# Patient Record
Sex: Female | Born: 1935 | Race: White | Hispanic: No | Smoking: Never smoker
Health system: Southern US, Community
[De-identification: ages and names within clinical notes are randomized; demographics above are authoritative.]

## PROBLEM LIST (undated history)

## (undated) DIAGNOSIS — G9341 Metabolic encephalopathy: Secondary | ICD-10-CM

## (undated) DIAGNOSIS — I5032 Chronic diastolic (congestive) heart failure: Secondary | ICD-10-CM

## (undated) DIAGNOSIS — G20A1 Parkinson's disease without dyskinesia, without mention of fluctuations: Secondary | ICD-10-CM

## (undated) DIAGNOSIS — J690 Pneumonitis due to inhalation of food and vomit: Secondary | ICD-10-CM

## (undated) DIAGNOSIS — J9621 Acute and chronic respiratory failure with hypoxia: Secondary | ICD-10-CM

## (undated) DIAGNOSIS — I10 Essential (primary) hypertension: Secondary | ICD-10-CM

## (undated) DIAGNOSIS — G20 Parkinson's disease: Secondary | ICD-10-CM

## (undated) HISTORY — PX: CATARACT EXTRACTION: SUR2

## (undated) HISTORY — PX: HIP SURGERY: SHX245

## (undated) HISTORY — DX: Essential (primary) hypertension: I10

---

## 2018-09-16 ENCOUNTER — Ambulatory Visit (HOSPITAL_COMMUNITY)
Admission: AD | Admit: 2018-09-16 | Discharge: 2018-09-16 | Disposition: A | Discharge status: Home / Self Care | Payer: Medicare Other | Source: Other Acute Inpatient Hospital | Attending: Internal Medicine | Admitting: Internal Medicine

## 2018-09-16 ENCOUNTER — Inpatient Hospital Stay (HOSPITAL_COMMUNITY)
Admission: RE | Admit: 2018-09-16 | Discharge: 2018-10-05 | Disposition: A | Discharge status: Skilled Nursing Facility | Payer: Medicare Other | Source: Other Acute Inpatient Hospital | Attending: Internal Medicine | Admitting: Internal Medicine

## 2018-09-16 DIAGNOSIS — R0902 Hypoxemia: Secondary | ICD-10-CM | POA: Diagnosis present

## 2018-09-16 DIAGNOSIS — J690 Pneumonitis due to inhalation of food and vomit: Secondary | ICD-10-CM | POA: Diagnosis present

## 2018-09-16 DIAGNOSIS — G9341 Metabolic encephalopathy: Secondary | ICD-10-CM | POA: Diagnosis present

## 2018-09-16 DIAGNOSIS — J9621 Acute and chronic respiratory failure with hypoxia: Secondary | ICD-10-CM | POA: Diagnosis present

## 2018-09-16 DIAGNOSIS — G20 Parkinson's disease: Secondary | ICD-10-CM | POA: Diagnosis present

## 2018-09-16 DIAGNOSIS — I5032 Chronic diastolic (congestive) heart failure: Secondary | ICD-10-CM | POA: Diagnosis present

## 2018-09-16 HISTORY — DX: Pneumonitis due to inhalation of food and vomit: J69.0

## 2018-09-16 HISTORY — DX: Acute and chronic respiratory failure with hypoxia: J96.21

## 2018-09-16 HISTORY — DX: Parkinson's disease without dyskinesia, without mention of fluctuations: G20.A1

## 2018-09-16 HISTORY — DX: Parkinson's disease: G20

## 2018-09-16 HISTORY — DX: Chronic diastolic (congestive) heart failure: I50.32

## 2018-09-16 HISTORY — DX: Metabolic encephalopathy: G93.41

## 2018-09-17 DIAGNOSIS — I5032 Chronic diastolic (congestive) heart failure: Secondary | ICD-10-CM

## 2018-09-17 DIAGNOSIS — J690 Pneumonitis due to inhalation of food and vomit: Secondary | ICD-10-CM | POA: Diagnosis not present

## 2018-09-17 DIAGNOSIS — G20 Parkinson's disease: Secondary | ICD-10-CM

## 2018-09-17 DIAGNOSIS — J9621 Acute and chronic respiratory failure with hypoxia: Secondary | ICD-10-CM | POA: Diagnosis not present

## 2018-09-17 DIAGNOSIS — G9341 Metabolic encephalopathy: Secondary | ICD-10-CM | POA: Diagnosis not present

## 2018-09-17 LAB — BASIC METABOLIC PANEL
Anion gap: 10 (ref 5–15)
BUN: 19 mg/dL (ref 8–23)
CO2: 32 mmol/L (ref 22–32)
Calcium: 8.4 mg/dL — ABNORMAL LOW (ref 8.9–10.3)
Chloride: 94 mmol/L — ABNORMAL LOW (ref 98–111)
Creatinine, Ser: 0.93 mg/dL (ref 0.44–1.00)
GFR calc Af Amer: >60 mL/min (ref >60)
GFR calc non Af Amer: 56 mL/min — ABNORMAL LOW (ref >60)
Glucose, Bld: 79 mg/dL (ref 70–99)
Potassium: 3.5 mmol/L (ref 3.5–5.1)
Sodium: 136 mmol/L (ref 135–145)

## 2018-09-17 LAB — CBC
HCT: 33.6 % — ABNORMAL LOW (ref 36.0–46.0)
Hemoglobin: 10.5 g/dL — ABNORMAL LOW (ref 12.0–15.0)
MCH: 31 pg (ref 26.0–34.0)
MCHC: 31.3 g/dL (ref 30.0–36.0)
MCV: 99.1 fL (ref 80.0–100.0)
Platelets: 237 10*3/uL (ref 150–400)
RBC: 3.39 MIL/uL — ABNORMAL LOW (ref 3.87–5.11)
RDW: 15.5 % (ref 11.5–15.5)
WBC: 7.5 10*3/uL (ref 4.0–10.5)
nRBC: 0 % (ref 0.0–0.2)

## 2018-09-17 NOTE — Consult Note (Signed)
Pulmonary Critical Care Medicine [facility] [place]  PULMONARY SERVICE  Date of Service: 09/17/2018  PULMONARY CRITICAL CARE CONSULT   Leasa Blaze  WUJ:[id number]  DOB: Aug 27, 1929   DOA: 09/16/2018  Referring Physician: Carron Curie, MD  HPI: Jazmin Crespin is a 85 y.o. female seen for follow up of Acute on Chronic Respiratory Failure.  She has a history of Parkinson's disease hypertension hyperlipidemia hypothyroidism cardiac failure pulmonary hypertension reflux of the veins presented to the hospital because of alteration in her mental status.  Patient at that time was found to be significantly hypotensive and felt to be septic.  Further evaluation revealed urinary tract infection and possible aspiration pneumonia.  Patient was started on Levophed and started on noninvasive ventilation initially.  Started on Rocephin as well as azithromycin which was later on changed to Zosyn and vancomycin.  Urine culture grew enterococcus.  Antibiotics were adjusted accordingly.  Now presents to our facility for further management and weaning.  At the time of evaluation she was on high flow nasal cannula  Review of Systems:  ROS performed and is unremarkable other than noted above.  Past medical history: Parkinson's disease Hypertension Hyperlipidemia Hypothyroidism Chronic diastolic heart failure Pulmonary hypertension Lower extremity venous insufficiency Chronic kidney disease Chronic anemia  Past surgical history: Tubal ligation Right hip surgery Total knee replacement Cataract surgery  Social history does not smoke or drink no alcohol or drug abuse  Allergies Sulfa  Family history: Noncontributory  Medications: Reviewed on Rounds  Physical Exam:  Vitals: Patient's temperature is 96.4 pulse 73 respiratory rate 15 blood pressure 149/80 saturations 93%  Ventilator Settings off the ventilator right now but is on Vapotherm 25 L flow rate with 50%  FiO2  . General: Comfortable at this time . Eyes: Grossly normal lids, irises & conjunctiva . ENT: grossly tongue is normal . Neck: no obvious mass . Cardiovascular: S1-S2 normal no gallop or rub is noted . Respiratory: No rhonchi or rales at this time . Abdomen: Soft and nontender . Skin: no rash seen on limited exam . Musculoskeletal: not rigid . Psychiatric:unable to assess . Neurologic: no seizure no involuntary movements         Labs on Admission:  Basic Metabolic Panel: Recent Labs  Lab 09/17/18 0425  NA 136  K 3.5  CL 94*  CO2 32  GLUCOSE 79  BUN 19  CREATININE 0.93  CALCIUM 8.4*    No results for input(s): PHART, PCO2ART, PO2ART, HCO3, O2SAT in the last 168 hours.  Liver Function Tests: No results for input(s): AST, ALT, ALKPHOS, BILITOT, PROT, ALBUMIN in the last 168 hours. No results for input(s): LIPASE, AMYLASE in the last 168 hours. No results for input(s): AMMONIA in the last 168 hours.  CBC: Recent Labs  Lab 09/17/18 0425  WBC 7.5  HGB 10.5*  HCT 33.6*  MCV 99.1  PLT 237    Cardiac Enzymes: No results for input(s): CKTOTAL, CKMB, CKMBINDEX, TROPONINI in the last 168 hours.  BNP (last 3 results) No results for input(s): BNP in the last 8760 hours.  ProBNP (last 3 results) No results for input(s): PROBNP in the last 8760 hours.   Radiological Exams on Admission: No results found.  Assessment/Plan Active Problems:   Acute on chronic respiratory failure with hypoxia (HCC)   Aspiration pneumonia due to gastric secretions (HCC)   Acute metabolic encephalopathy   Parkinson's disease (tremor, stiffness, slow motion, unstable posture) (HCC)   Chronic diastolic heart failure (HCC)  1. Acute on chronic respiratory failure with hypoxia patient continues to be on Vapotherm.  Need to titrate oxygen down as tolerated.  Continue with aggressive pulmonary toilet and supportive care.  Chest x-ray should be done 2. Pneumonia due to aspiration the  patient has been treated with broad-spectrum antibiotics.  We will continue with supportive care speech therapy will assess the patient. 3. Metabolic encephalopathy acute improving patient is more awake and alert 4. Acute renal failure we will follow-up labs monitor BUN/creatinine and fluid hydration status.  Right now has resolved 5. Parkinson's disease advanced disease will need a swallowing assessment 6. Chronic diastolic heart failure monitor fluid status diuretics as necessary  I have personally seen and evaluated the patient, evaluated laboratory and imaging results, formulated the assessment and plan and placed orders. The Patient requires high complexity decision making for assessment and support.  Case was discussed on Rounds with the Respiratory Therapy Staff Time Spent  Yevonne Pax, MD [facility] Pulmonary Critical Care Medicine Sleep Medicine

## 2018-09-18 ENCOUNTER — Encounter: Payer: Self-pay | Admitting: Internal Medicine

## 2018-09-18 DIAGNOSIS — G9341 Metabolic encephalopathy: Secondary | ICD-10-CM | POA: Diagnosis present

## 2018-09-18 DIAGNOSIS — I5032 Chronic diastolic (congestive) heart failure: Secondary | ICD-10-CM | POA: Diagnosis not present

## 2018-09-18 DIAGNOSIS — G20 Parkinson's disease: Secondary | ICD-10-CM | POA: Diagnosis present

## 2018-09-18 DIAGNOSIS — J9621 Acute and chronic respiratory failure with hypoxia: Secondary | ICD-10-CM | POA: Diagnosis not present

## 2018-09-18 DIAGNOSIS — J690 Pneumonitis due to inhalation of food and vomit: Secondary | ICD-10-CM | POA: Diagnosis present

## 2018-09-18 NOTE — Progress Notes (Addendum)
Pulmonary Critical Care Medicine [facility] [place]   PULMONARY CRITICAL CARE SERVICE  PROGRESS NOTE  Date of Service: 09/18/2018  Romelle Ille  [id number]:[id number]  DOB: 08-13-1929   DOA: 09/16/2018  Referring Physician: Carron Curie, MD  HPI: Maricela Alling is a 85 y.o. female seen for follow up of Acute on Chronic Respiratory Failure.  Patient remains on heated high flow nasal cannula 20 L and 40% FiO2.  No acute distress is noted at this time.  Medications: Reviewed on Rounds  Physical Exam:  Vitals: Pulse 81 respiration 17 BP 103/54 O2 sat 94% temp 97.9  Ventilator Settings heated high flow 20 L 40%  . General: Comfortable at this time . Eyes: Grossly normal lids, irises & conjunctiva . ENT: grossly tongue is normal . Neck: no obvious mass . Cardiovascular: S1 S2 normal no gallop . Respiratory: No rales or rhonchi noted . Abdomen: soft . Skin: no rash seen on limited exam . Musculoskeletal: not rigid . Psychiatric:unable to assess . Neurologic: no seizure no involuntary movements         Lab Data:   Basic Metabolic Panel: Recent Labs  Lab 09/17/18 0425  NA 136  K 3.5  CL 94*  CO2 32  GLUCOSE 79  BUN 19  CREATININE 0.93  CALCIUM 8.4*    ABG: No results for input(s): PHART, PCO2ART, PO2ART, HCO3, O2SAT in the last 168 hours.  Liver Function Tests: No results for input(s): AST, ALT, ALKPHOS, BILITOT, PROT, ALBUMIN in the last 168 hours. No results for input(s): LIPASE, AMYLASE in the last 168 hours. No results for input(s): AMMONIA in the last 168 hours.  CBC: Recent Labs  Lab 09/17/18 0425  WBC 7.5  HGB 10.5*  HCT 33.6*  MCV 99.1  PLT 237    Cardiac Enzymes: No results for input(s): CKTOTAL, CKMB, CKMBINDEX, TROPONINI in the last 168 hours.  BNP (last 3 results) No results for input(s): BNP in the last 8760 hours.  ProBNP (last 3 results) No results for input(s): PROBNP in the last 8760 hours.  Radiological Exams: No  results found.  Assessment/Plan Active Problems:   Acute on chronic respiratory failure with hypoxia (HCC)   Aspiration pneumonia due to gastric secretions (HCC)   Acute metabolic encephalopathy   Parkinson's disease (tremor, stiffness, slow motion, unstable posture) (HCC)   Chronic diastolic heart failure (HCC)   1. Acute on chronic respiratory failure with hypoxia patient continues to be on heated high flow nasal cannula.  Continue titrating oxygen as tolerated.  Continue pulmonary toilet and supportive measures. 2. Aspiration pneumonia treated continue supportive measures 3. Metabolic encephalopathy acute improving at this time 4. Acute renal failure continue to follow labs 5. Parkinson's disease advanced disease will need swallowing assessment 6. Chronic diastolic heart failure monitor fluid status and use diuretics as necessary.   I have personally seen and evaluated the patient, evaluated laboratory and imaging results, formulated the assessment and plan and placed orders. The Patient requires high complexity decision making for assessment and support.  Case was discussed on Rounds with the Respiratory Therapy Staff  Yevonne Pax, MD [facility] Pulmonary Critical Care Medicine Sleep Medicine

## 2018-09-19 DIAGNOSIS — J9621 Acute and chronic respiratory failure with hypoxia: Secondary | ICD-10-CM | POA: Diagnosis not present

## 2018-09-19 DIAGNOSIS — G9341 Metabolic encephalopathy: Secondary | ICD-10-CM | POA: Diagnosis not present

## 2018-09-19 DIAGNOSIS — J690 Pneumonitis due to inhalation of food and vomit: Secondary | ICD-10-CM | POA: Diagnosis not present

## 2018-09-19 DIAGNOSIS — I5032 Chronic diastolic (congestive) heart failure: Secondary | ICD-10-CM | POA: Diagnosis not present

## 2018-09-19 NOTE — Progress Notes (Addendum)
Pulmonary Critical Care Medicine [facility] [place]   PULMONARY CRITICAL CARE SERVICE  PROGRESS NOTE  Date of Service: September 19, 2018  Jessica Bullock  [id number]  DOB: 18-May-1930   DOA: 09/16/2018  Referring Physician: Carron Curie, MD  HPI: Jessica Bullock is a 85 y.o. female seen for follow up of Acute on Chronic Respiratory Failure.  Patient remains on heated high flow nasal cannula 20 L and 40% FiO2.  Respiratory managed to decrease patient's FiO2 to 35% for a few hours before she began to desat.  Now back on 40% FiO2.  Medications: Reviewed on Rounds  Physical Exam:  Vitals: Pulse 72 respirations 18 BP 106/53 O2 sat 94% temp 98.2  Ventilator Settings heated high flow 20 L 40% FiO2  . General: Comfortable at this time . Eyes: Grossly normal lids, irises & conjunctiva . ENT: grossly tongue is normal . Neck: no obvious mass . Cardiovascular: S1 S2 normal no gallop . Respiratory: No rales or rhonchi noted . Abdomen: soft . Skin: no rash seen on limited exam . Musculoskeletal: not rigid . Psychiatric:unable to assess . Neurologic: no seizure no involuntary movements         Lab Data:   Basic Metabolic Panel: Recent Labs  Lab 09/17/18 0425  NA 136  K 3.5  CL 94*  CO2 32  GLUCOSE 79  BUN 19  CREATININE 0.93  CALCIUM 8.4*    ABG: No results for input(s): PHART, PCO2ART, PO2ART, HCO3, O2SAT in the last 168 hours.  Liver Function Tests: No results for input(s): AST, ALT, ALKPHOS, BILITOT, PROT, ALBUMIN in the last 168 hours. No results for input(s): LIPASE, AMYLASE in the last 168 hours. No results for input(s): AMMONIA in the last 168 hours.  CBC: Recent Labs  Lab 09/17/18 0425  WBC 7.5  HGB 10.5*  HCT 33.6*  MCV 99.1  PLT 237    Cardiac Enzymes: No results for input(s): CKTOTAL, CKMB, CKMBINDEX, TROPONINI in the last 168 hours.  BNP (last 3 results) No results for input(s): BNP in the last 8760 hours.  ProBNP (last 3  results) No results for input(s): PROBNP in the last 8760 hours.  Radiological Exams: No results found.  Assessment/Plan Active Problems:   Acute on chronic respiratory failure with hypoxia (HCC)   Aspiration pneumonia due to gastric secretions (HCC)   Acute metabolic encephalopathy   Parkinson's disease (tremor, stiffness, slow motion, unstable posture) (HCC)   Chronic diastolic heart failure (HCC)   1. Acute on chronic respiratory failure with hypoxia patient continues to be on heated high flow nasal cannula.  We will continue to attempt to wean oxygen as tolerated.  Continue pulmonary toilet and secretion management. 2. Aspiration pneumonia treated continue boarding measures 3. Metabolic encephalopathy acute improving at this time 4. Acute renal failure continue to follow labs 5. Parkinson's disease advanced disease continue supportive measures 6. Chronic diastolic heart failure monitor the status and use of diuretics.   I have personally seen and evaluated the patient, evaluated laboratory and imaging results, formulated the assessment and plan and placed orders. The Patient requires high complexity decision making for assessment and support.  Case was discussed on Rounds with the Respiratory Therapy Staff  Yevonne Pax, MD [facility] Pulmonary Critical Care Medicine Sleep Medicine

## 2018-09-20 DIAGNOSIS — J690 Pneumonitis due to inhalation of food and vomit: Secondary | ICD-10-CM | POA: Diagnosis not present

## 2018-09-20 DIAGNOSIS — J9621 Acute and chronic respiratory failure with hypoxia: Secondary | ICD-10-CM | POA: Diagnosis not present

## 2018-09-20 DIAGNOSIS — I5032 Chronic diastolic (congestive) heart failure: Secondary | ICD-10-CM | POA: Diagnosis not present

## 2018-09-20 DIAGNOSIS — G9341 Metabolic encephalopathy: Secondary | ICD-10-CM | POA: Diagnosis not present

## 2018-09-20 NOTE — Progress Notes (Addendum)
Pulmonary Critical Care Medicine [facility] [place]   PULMONARY CRITICAL CARE SERVICE  PROGRESS NOTE  Date of Service: 09/20/2018  Jessica Bullock  [id number]  DOB: 25-Oct-1929   DOA: 09/16/2018  Referring Physician: Carron Curie, MD  HPI: Jessica Bullock is a 85 y.o. female seen for follow up of Acute on Chronic Respiratory Failure.  Patient remains on heated high flow nasal cannula 20 L and 40% FiO2.  No distress is noted at this time.  Medications: Reviewed on Rounds  Physical Exam:  Vitals: Pulse 96 respirations 31 BP 140/61 O2 sat 96% temp 98.1  Ventilator Settings heated high flow 20 L 40% FiO2  . General: Comfortable at this time . Eyes: Grossly normal lids, irises & conjunctiva . ENT: grossly tongue is normal . Neck: no obvious mass . Cardiovascular: S1 S2 normal no gallop . Respiratory: No rales or rhonchi noted . Abdomen: soft . Skin: no rash seen on limited exam . Musculoskeletal: not rigid . Psychiatric:unable to assess . Neurologic: no seizure no involuntary movements         Lab Data:   Basic Metabolic Panel: Recent Labs  Lab 09/17/18 0425  NA 136  K 3.5  CL 94*  CO2 32  GLUCOSE 79  BUN 19  CREATININE 0.93  CALCIUM 8.4*    ABG: No results for input(s): PHART, PCO2ART, PO2ART, HCO3, O2SAT in the last 168 hours.  Liver Function Tests: No results for input(s): AST, ALT, ALKPHOS, BILITOT, PROT, ALBUMIN in the last 168 hours. No results for input(s): LIPASE, AMYLASE in the last 168 hours. No results for input(s): AMMONIA in the last 168 hours.  CBC: Recent Labs  Lab 09/17/18 0425  WBC 7.5  HGB 10.5*  HCT 33.6*  MCV 99.1  PLT 237    Cardiac Enzymes: No results for input(s): CKTOTAL, CKMB, CKMBINDEX, TROPONINI in the last 168 hours.  BNP (last 3 results) No results for input(s): BNP in the last 8760 hours.  ProBNP (last 3 results) No results for input(s): PROBNP in the last 8760 hours.  Radiological Exams: No  results found.  Assessment/Plan Active Problems:   Acute on chronic respiratory failure with hypoxia (HCC)   Aspiration pneumonia due to gastric secretions (HCC)   Acute metabolic encephalopathy   Parkinson's disease (tremor, stiffness, slow motion, unstable posture) (HCC)   Chronic diastolic heart failure (HCC)   1. Acute on chronic respiratory failure with hypoxia patient mains heated high flow.  Continue to attempt to wean FiO2 at this time.  Continue pulmonary toilet and secretion management. 2. Aspiration pneumonia treated continue supportive measures 3. Metabolic encephalopathy acute, improving at this time 4. Acute on failure continue follow labs 5. Parkinson's disease advanced disease continue supportive measures 6. Chronic diastolic heart failure monitor fluid status and use of diuretics   I have personally seen and evaluated the patient, evaluated laboratory and imaging results, formulated the assessment and plan and placed orders. The Patient requires high complexity decision making for assessment and support.  Case was discussed on Rounds with the Respiratory Therapy Staff  Yevonne Pax, MD [facility] Pulmonary Critical Care Medicine Sleep Medicine

## 2018-09-21 DIAGNOSIS — I5032 Chronic diastolic (congestive) heart failure: Secondary | ICD-10-CM | POA: Diagnosis not present

## 2018-09-21 DIAGNOSIS — J690 Pneumonitis due to inhalation of food and vomit: Secondary | ICD-10-CM | POA: Diagnosis not present

## 2018-09-21 DIAGNOSIS — J9621 Acute and chronic respiratory failure with hypoxia: Secondary | ICD-10-CM | POA: Diagnosis not present

## 2018-09-21 DIAGNOSIS — G9341 Metabolic encephalopathy: Secondary | ICD-10-CM | POA: Diagnosis not present

## 2018-09-21 NOTE — Progress Notes (Addendum)
Pulmonary Critical Care Medicine [facility] [place]   PULMONARY CRITICAL CARE SERVICE  PROGRESS NOTE  Date of Service: 09/21/2018  Jessica Bullock  [id number]  DOB: June 06, 1930   DOA: 09/16/2018  Referring Physician: Carron Curie, MD  HPI: Jessica Bullock is a 85 y.o. female seen for follow up of Acute on Chronic Respiratory Failure.  Patient remains on heated high flow nasal cannula 15 L and 30% FiO2.  She is doing well and respiratory will try to downgrade to an Oxymizer at this time.  Medications: Reviewed on Rounds  Physical Exam:  Vitals: Pulse 79 respirations 12 BP 101/56 O2 sat 94% temp 97.3  Ventilator Settings heated high flow 15 L 30% FiO2  . General: Comfortable at this time . Eyes: Grossly normal lids, irises & conjunctiva . ENT: grossly tongue is normal . Neck: no obvious mass . Cardiovascular: S1 S2 normal no gallop . Respiratory: No rales or rhonchi noted . Abdomen: soft . Skin: no rash seen on limited exam . Musculoskeletal: not rigid . Psychiatric:unable to assess . Neurologic: no seizure no involuntary movements         Lab Data:   Basic Metabolic Panel: Recent Labs  Lab 09/17/18 0425  NA 136  K 3.5  CL 94*  CO2 32  GLUCOSE 79  BUN 19  CREATININE 0.93  CALCIUM 8.4*    ABG: No results for input(s): PHART, PCO2ART, PO2ART, HCO3, O2SAT in the last 168 hours.  Liver Function Tests: No results for input(s): AST, ALT, ALKPHOS, BILITOT, PROT, ALBUMIN in the last 168 hours. No results for input(s): LIPASE, AMYLASE in the last 168 hours. No results for input(s): AMMONIA in the last 168 hours.  CBC: Recent Labs  Lab 09/17/18 0425  WBC 7.5  HGB 10.5*  HCT 33.6*  MCV 99.1  PLT 237    Cardiac Enzymes: No results for input(s): CKTOTAL, CKMB, CKMBINDEX, TROPONINI in the last 168 hours.  BNP (last 3 results) No results for input(s): BNP in the last 8760 hours.  ProBNP (last 3 results) No results for input(s): PROBNP in  the last 8760 hours.  Radiological Exams: No results found.  Assessment/Plan Active Problems:   Acute on chronic respiratory failure with hypoxia (HCC)   Aspiration pneumonia due to gastric secretions (HCC)   Acute metabolic encephalopathy   Parkinson's disease (tremor, stiffness, slow motion, unstable posture) (HCC)   Chronic diastolic heart failure (HCC)   1. Acute on chronic respiratory failure with hypoxia patient continues on heated high flow however down to 15 L and 30% FiO2.  Doing well enough that we will try on an Oxymizer today and continue weaning if possible.  Continue high toilet and secretion management. 2. Aspiration pneumonia treated continue supportive measures 3. Acute metabolic encephalopathy, improving at this time. 4. Acute Renal failure continue to follow 5. Parkinson's disease, advanced disease, continue supportive measures 6. Chronic diastolic heart failure,monitor fluid stats and use of diuretics.    I have personally seen and evaluated the patient, evaluated laboratory and imaging results, formulated the assessment and plan and placed orders. The Patient requires high complexity decision making for assessment and support.  Case was discussed on Rounds with the Respiratory Therapy Staff  Yevonne Pax, MD [facility] Pulmonary Critical Care Medicine Sleep Medicine

## 2018-09-22 DIAGNOSIS — J690 Pneumonitis due to inhalation of food and vomit: Secondary | ICD-10-CM | POA: Diagnosis not present

## 2018-09-22 DIAGNOSIS — G9341 Metabolic encephalopathy: Secondary | ICD-10-CM | POA: Diagnosis not present

## 2018-09-22 DIAGNOSIS — I5032 Chronic diastolic (congestive) heart failure: Secondary | ICD-10-CM | POA: Diagnosis not present

## 2018-09-22 DIAGNOSIS — J9621 Acute and chronic respiratory failure with hypoxia: Secondary | ICD-10-CM | POA: Diagnosis not present

## 2018-09-22 LAB — BASIC METABOLIC PANEL
Anion gap: 11 (ref 5–15)
BUN: 25 mg/dL — ABNORMAL HIGH (ref 8–23)
CO2: 25 mmol/L (ref 22–32)
Calcium: 9.4 mg/dL (ref 8.9–10.3)
Chloride: 98 mmol/L (ref 98–111)
Creatinine, Ser: 1.23 mg/dL — ABNORMAL HIGH (ref 0.44–1.00)
GFR calc Af Amer: 46 mL/min — ABNORMAL LOW (ref >60)
GFR calc non Af Amer: 40 mL/min — ABNORMAL LOW (ref >60)
Glucose, Bld: 100 mg/dL — ABNORMAL HIGH (ref 70–99)
Potassium: 4.8 mmol/L (ref 3.5–5.1)
Sodium: 134 mmol/L — ABNORMAL LOW (ref 135–145)

## 2018-09-22 NOTE — Progress Notes (Addendum)
Pulmonary Critical Care Medicine [facility] [place]   PULMONARY CRITICAL CARE SERVICE  PROGRESS NOTE  Date of Service: 09/22/2018  Jessica Bullock  [id number]  DOB: April 19, 1930   DOA: 09/16/2018  Referring Physician: Carron Curie, MD  HPI: Jessica Bullock is a 85 y.o. female seen for follow up of Acute on Chronic Respiratory Failure.  Patient has been weaned from heated high flow and cannula down to 6 L on the Oxymizer.  Doing well with good saturation.  Medications: Reviewed on Rounds  Physical Exam:  Vitals: Pulse 67 respirations 18 BP 143/76 O2 sat 100% temp 97.8  Ventilator Settings 6 L on Oxymizer  . General: Comfortable at this time . Eyes: Grossly normal lids, irises & conjunctiva . ENT: grossly tongue is normal . Neck: no obvious mass . Cardiovascular: S1 S2 normal no gallop . Respiratory: No rales or rhonchi noted . Abdomen: soft . Skin: no rash seen on limited exam . Musculoskeletal: not rigid . Psychiatric:unable to assess . Neurologic: no seizure no involuntary movements         Lab Data:   Basic Metabolic Panel: Recent Labs  Lab 09/17/18 0425 09/22/18 1322  NA 136 134*  K 3.5 4.8  CL 94* 98  CO2 32 25  GLUCOSE 79 100*  BUN 19 25*  CREATININE 0.93 1.23*  CALCIUM 8.4* 9.4    ABG: No results for input(s): PHART, PCO2ART, PO2ART, HCO3, O2SAT in the last 168 hours.  Liver Function Tests: No results for input(s): AST, ALT, ALKPHOS, BILITOT, PROT, ALBUMIN in the last 168 hours. No results for input(s): LIPASE, AMYLASE in the last 168 hours. No results for input(s): AMMONIA in the last 168 hours.  CBC: Recent Labs  Lab 09/17/18 0425  WBC 7.5  HGB 10.5*  HCT 33.6*  MCV 99.1  PLT 237    Cardiac Enzymes: No results for input(s): CKTOTAL, CKMB, CKMBINDEX, TROPONINI in the last 168 hours.  BNP (last 3 results) No results for input(s): BNP in the last 8760 hours.  ProBNP (last 3 results) No results for input(s): PROBNP  in the last 8760 hours.  Radiological Exams: No results found.  Assessment/Plan Active Problems:   Acute on chronic respiratory failure with hypoxia (HCC)   Aspiration pneumonia due to gastric secretions (HCC)   Acute metabolic encephalopathy   Parkinson's disease (tremor, stiffness, slow motion, unstable posture) (HCC)   Chronic diastolic heart failure (HCC)   1. Acute on chronic respiratory failure with hypoxia patient has been weaned down to 6 L on the Oxymizer.  Currently satting well.  Continue medical and secretion management 2. Aspiration pneumonia treated continue supportive measures 3. Acute metabolic encephalopathy improving at this time 4. Acute continue to follow 5. Parkinson's disease main disease continue supportive measures 6. Chronic diastolic heart failure monitor fluid status and use of drugs   I have personally seen and evaluated the patient, evaluated laboratory and imaging results, formulated the assessment and plan and placed orders. The Patient requires high complexity decision making for assessment and support.  Case was discussed on Rounds with the Respiratory Therapy Staff  Yevonne Pax, MD [facility] Pulmonary Critical Care Medicine Sleep Medicine

## 2018-09-23 DIAGNOSIS — G9341 Metabolic encephalopathy: Secondary | ICD-10-CM | POA: Diagnosis not present

## 2018-09-23 DIAGNOSIS — J690 Pneumonitis due to inhalation of food and vomit: Secondary | ICD-10-CM | POA: Diagnosis not present

## 2018-09-23 DIAGNOSIS — J9621 Acute and chronic respiratory failure with hypoxia: Secondary | ICD-10-CM | POA: Diagnosis not present

## 2018-09-23 DIAGNOSIS — I5032 Chronic diastolic (congestive) heart failure: Secondary | ICD-10-CM | POA: Diagnosis not present

## 2018-09-23 NOTE — Progress Notes (Addendum)
Pulmonary Critical Care Medicine [facility] [place]   PULMONARY CRITICAL CARE SERVICE  PROGRESS NOTE  Date of Service: 09/23/2018  Jessica Bullock  [id number]  DOB: [birth year]   DOA: 09/16/2018  Referring Physician: Carron Curie, MD  HPI: Jessica Bullock is a 85 y.o. female seen for follow up of Acute on Chronic Respiratory Failure.  Remains on 4 L of oxygen via nasal cannula.  No acute distress noted at this time.  Medications: Reviewed on Rounds  Physical Exam:  Vitals: Pulse 100 respirations 27 BP 130/65 O2 sat 97% 6.8  Ventilator Settings 4 L nasal cannula  . General: Comfortable at this time . Eyes: Grossly normal lids, irises & conjunctiva . ENT: grossly tongue is normal . Neck: no obvious mass . Cardiovascular: S1 S2 normal no gallop . Respiratory: No rales or rhonchi noted . Abdomen: soft . Skin: no rash seen on limited exam . Musculoskeletal: not rigid . Psychiatric:unable to assess . Neurologic: no seizure no involuntary movements         Lab Data:   Basic Metabolic Panel: Recent Labs  Lab 09/17/18 0425 09/22/18 1322  NA 136 134*  K 3.5 4.8  CL 94* 98  CO2 32 25  GLUCOSE 79 100*  BUN 19 25*  CREATININE 0.93 1.23*  CALCIUM 8.4* 9.4    ABG: No results for input(s): PHART, PCO2ART, PO2ART, HCO3, O2SAT in the last 168 hours.  Liver Function Tests: No results for input(s): AST, ALT, ALKPHOS, BILITOT, PROT, ALBUMIN in the last 168 hours. No results for input(s): LIPASE, AMYLASE in the last 168 hours. No results for input(s): AMMONIA in the last 168 hours.  CBC: Recent Labs  Lab 09/17/18 0425  WBC 7.5  HGB 10.5*  HCT 33.6*  MCV 99.1  PLT 237    Cardiac Enzymes: No results for input(s): CKTOTAL, CKMB, CKMBINDEX, TROPONINI in the last 168 hours.  BNP (last 3 results) No results for input(s): BNP in the last 8760 hours.  ProBNP (last 3 results) No results for input(s): PROBNP in the last 8760 hours.  Radiological  Exams: No results found.  Assessment/Plan Active Problems:   Acute on chronic respiratory failure with hypoxia (HCC)   Aspiration pneumonia due to gastric secretions (HCC)   Acute metabolic encephalopathy   Parkinson's disease (tremor, stiffness, slow motion, unstable posture) (HCC)   Chronic diastolic heart failure (HCC)   1. Acute on chronic respiratory failure with hypoxia patient has been weaned from Oxymizer down to 4 L via nasal cannula.  Doing well at this time with minimal secretions noted.  Continue present management 2. Aspiration only treated continue supportive measures 3. Acute metabolic encephalopathy improving at this time 4. Acute renal failure continue to follow 5. Parkinson's disease advanced disease continue supportive measures 6. Chronic diastolic heart failure monitor fluid status and use of diuretics   I have personally seen and evaluated the patient, evaluated laboratory and imaging results, formulated the assessment and plan and placed orders. The Patient requires high complexity decision making for assessment and support.  Case was discussed on Rounds with the Respiratory Therapy Staff  Yevonne Pax, MD [facility] Pulmonary Critical Care Medicine Sleep Medicine

## 2018-09-24 DIAGNOSIS — J9621 Acute and chronic respiratory failure with hypoxia: Secondary | ICD-10-CM | POA: Diagnosis not present

## 2018-09-24 DIAGNOSIS — G9341 Metabolic encephalopathy: Secondary | ICD-10-CM | POA: Diagnosis not present

## 2018-09-24 DIAGNOSIS — I5032 Chronic diastolic (congestive) heart failure: Secondary | ICD-10-CM | POA: Diagnosis not present

## 2018-09-24 DIAGNOSIS — J690 Pneumonitis due to inhalation of food and vomit: Secondary | ICD-10-CM | POA: Diagnosis not present

## 2018-09-24 NOTE — Progress Notes (Addendum)
Pulmonary Critical Care Medicine [facility] [place]   PULMONARY CRITICAL CARE SERVICE  PROGRESS NOTE  Date of Service: 09/24/2018  Jessica Bullock  [id number]  DOB: 07-Apr-1930   DOA: 09/16/2018  Referring Physician: Carron Curie, MD  HPI: Jessica Bullock is a 85 y.o. female seen for follow up of Acute on Chronic Respiratory Failure.  Patients oxygen decreased to 2 L via nasal cannula.  No distress noted at this time.  Medications: Reviewed on Rounds  Physical Exam:  Vitals: Pulse 79 respirations 28 BP 119/61 O2 sat 97% temp 96.4  Ventilator Settings 2 L nasal cannula  . General: Comfortable at this time . Eyes: Grossly normal lids, irises & conjunctiva . ENT: grossly tongue is normal . Neck: no obvious mass . Cardiovascular: S1 S2 normal no gallop . Respiratory: No rales or rhonchi noted . Abdomen: soft . Skin: no rash seen on limited exam . Musculoskeletal: not rigid . Psychiatric:unable to assess . Neurologic: no seizure no involuntary movements         Lab Data:   Basic Metabolic Panel: Recent Labs  Lab 09/22/18 1322  NA 134*  K 4.8  CL 98  CO2 25  GLUCOSE 100*  BUN 25*  CREATININE 1.23*  CALCIUM 9.4    ABG: No results for input(s): PHART, PCO2ART, PO2ART, HCO3, O2SAT in the last 168 hours.  Liver Function Tests: No results for input(s): AST, ALT, ALKPHOS, BILITOT, PROT, ALBUMIN in the last 168 hours. No results for input(s): LIPASE, AMYLASE in the last 168 hours. No results for input(s): AMMONIA in the last 168 hours.  CBC: No results for input(s): WBC, NEUTROABS, HGB, HCT, MCV, PLT in the last 168 hours.  Cardiac Enzymes: No results for input(s): CKTOTAL, CKMB, CKMBINDEX, TROPONINI in the last 168 hours.  BNP (last 3 results) No results for input(s): BNP in the last 8760 hours.  ProBNP (last 3 results) No results for input(s): PROBNP in the last 8760 hours.  Radiological Exams: No results  found.  Assessment/Plan Active Problems:   Acute on chronic respiratory failure with hypoxia (HCC)   Aspiration pneumonia due to gastric secretions (HCC)   Acute metabolic encephalopathy   Parkinson's disease (tremor, stiffness, slow motion, unstable posture) (HCC)   Chronic diastolic heart failure (HCC)   1. Acute on chronic respiratory failure with hypoxia patient remains on 2 L via nasal cannula with no deficit noted continue present therapy. 2. Aspiration pneumonia treated continue supportive measures 3. Acute metabolic encephalopathy improving at this time 4. Acute renal failure continue to follow 5. Parkinson's disease advanced disease continue supportive measures 6. Chronic diastolic heart failure monitor fluid status and use of diuretics   I have personally seen and evaluated the patient, evaluated laboratory and imaging results, formulated the assessment and plan and placed orders. The Patient requires high complexity decision making for assessment and support.  Case was discussed on Rounds with the Respiratory Therapy Staff  Yevonne Pax, MD [facility] Pulmonary Critical Care Medicine Sleep Medicine

## 2018-09-25 DIAGNOSIS — J9621 Acute and chronic respiratory failure with hypoxia: Secondary | ICD-10-CM | POA: Diagnosis not present

## 2018-09-25 DIAGNOSIS — J690 Pneumonitis due to inhalation of food and vomit: Secondary | ICD-10-CM | POA: Diagnosis not present

## 2018-09-25 DIAGNOSIS — G9341 Metabolic encephalopathy: Secondary | ICD-10-CM | POA: Diagnosis not present

## 2018-09-25 DIAGNOSIS — I5032 Chronic diastolic (congestive) heart failure: Secondary | ICD-10-CM | POA: Diagnosis not present

## 2018-09-25 LAB — BASIC METABOLIC PANEL
Anion gap: 10 (ref 5–15)
BUN: 22 mg/dL (ref 8–23)
CO2: 25 mmol/L (ref 22–32)
Calcium: 9.1 mg/dL (ref 8.9–10.3)
Chloride: 97 mmol/L — ABNORMAL LOW (ref 98–111)
Creatinine, Ser: 1 mg/dL (ref 0.44–1.00)
GFR calc Af Amer: 59 mL/min — ABNORMAL LOW (ref >60)
GFR calc non Af Amer: 51 mL/min — ABNORMAL LOW (ref >60)
Glucose, Bld: 76 mg/dL (ref 70–99)
Potassium: 4.5 mmol/L (ref 3.5–5.1)
Sodium: 132 mmol/L — ABNORMAL LOW (ref 135–145)

## 2018-09-25 NOTE — Progress Notes (Addendum)
Pulmonary Critical Care Medicine [facility] [place]   PULMONARY CRITICAL CARE SERVICE  PROGRESS NOTE  Date of Service: 09/25/2018  Jessica Bullock  [id number]  DOB: 10-Apr-1930   DOA: 09/16/2018  Referring Physician: Carron Curie, MD  HPI: Jessica Bullock is a 85 y.o. female seen for follow up of Acute on Chronic Respiratory Failure.  Patient continues on 2 L of oxygen via nasal cannula.  No distress noted at this time.  Medications: Reviewed on Rounds  Physical Exam:  Vitals: Pulse 80 respirations 18 BP 92/46 O2 sat 96% temp 95.8  Ventilator Settings 2 L per cannula  . General: Comfortable at this time . Eyes: Grossly normal lids, irises & conjunctiva . ENT: grossly tongue is normal . Neck: no obvious mass . Cardiovascular: S1 S2 normal no gallop . Respiratory: No rales or rhonchi noted . Abdomen: soft . Skin: no rash seen on limited exam . Musculoskeletal: not rigid . Psychiatric:unable to assess . Neurologic: no seizure no involuntary movements         Lab Data:   Basic Metabolic Panel: Recent Labs  Lab 09/22/18 1322 09/25/18 0400  NA 134* 132*  K 4.8 4.5  CL 98 97*  CO2 25 25  GLUCOSE 100* 76  BUN 25* 22  CREATININE 1.23* 1.00  CALCIUM 9.4 9.1    ABG: No results for input(s): PHART, PCO2ART, PO2ART, HCO3, O2SAT in the last 168 hours.  Liver Function Tests: No results for input(s): AST, ALT, ALKPHOS, BILITOT, PROT, ALBUMIN in the last 168 hours. No results for input(s): LIPASE, AMYLASE in the last 168 hours. No results for input(s): AMMONIA in the last 168 hours.  CBC: No results for input(s): WBC, NEUTROABS, HGB, HCT, MCV, PLT in the last 168 hours.  Cardiac Enzymes: No results for input(s): CKTOTAL, CKMB, CKMBINDEX, TROPONINI in the last 168 hours.  BNP (last 3 results) No results for input(s): BNP in the last 8760 hours.  ProBNP (last 3 results) No results for input(s): PROBNP in the last 8760 hours.  Radiological  Exams: No results found.  Assessment/Plan Active Problems:   Acute on chronic respiratory failure with hypoxia (HCC)   Aspiration pneumonia due to gastric secretions (HCC)   Acute metabolic encephalopathy   Parkinson's disease (tremor, stiffness, slow motion, unstable posture) (HCC)   Chronic diastolic heart failure (HCC)   1. Acute on chronic respiratory failure with hypoxia patient remains on 2 L at this time with no distress noted.  Continue present management. 2. Aspiration pneumonia treated continue to monitor 3. Acute metabolic encephalopathy improving at this time 4. Acute renal failure continue to follow 5. Parkinson's disease advanced disease continue supportive measures 6. Chronic diastolic heart failure monitor fluid status and use of diuretics   I have personally seen and evaluated the patient, evaluated laboratory and imaging results, formulated the assessment and plan and placed orders. The Patient requires high complexity decision making for assessment and support.  Case was discussed on Rounds with the Respiratory Therapy Staff  Yevonne Pax, MD [facility] Pulmonary Critical Care Medicine Sleep Medicine

## 2018-09-26 DIAGNOSIS — G9341 Metabolic encephalopathy: Secondary | ICD-10-CM | POA: Diagnosis not present

## 2018-09-26 DIAGNOSIS — J9621 Acute and chronic respiratory failure with hypoxia: Secondary | ICD-10-CM | POA: Diagnosis not present

## 2018-09-26 DIAGNOSIS — J690 Pneumonitis due to inhalation of food and vomit: Secondary | ICD-10-CM | POA: Diagnosis not present

## 2018-09-26 DIAGNOSIS — I5032 Chronic diastolic (congestive) heart failure: Secondary | ICD-10-CM | POA: Diagnosis not present

## 2018-09-26 LAB — BASIC METABOLIC PANEL
Anion gap: 9 (ref 5–15)
BUN: 22 mg/dL (ref 8–23)
CO2: 25 mmol/L (ref 22–32)
Calcium: 9.2 mg/dL (ref 8.9–10.3)
Chloride: 99 mmol/L (ref 98–111)
Creatinine, Ser: 1.04 mg/dL — ABNORMAL HIGH (ref 0.44–1.00)
GFR calc Af Amer: 56 mL/min — ABNORMAL LOW (ref >60)
GFR calc non Af Amer: 49 mL/min — ABNORMAL LOW (ref >60)
Glucose, Bld: 87 mg/dL (ref 70–99)
Potassium: 4.6 mmol/L (ref 3.5–5.1)
Sodium: 133 mmol/L — ABNORMAL LOW (ref 135–145)

## 2018-09-26 NOTE — Progress Notes (Addendum)
Pulmonary Critical Care Medicine [facility] [place]   PULMONARY CRITICAL CARE SERVICE  PROGRESS NOTE  Date of Service: 09/26/2018  Jessica Bullock  [id number]  DOB: 08/01/1929   DOA: 09/16/2018  Referring Physician: Carron Curie, MD  HPI: Jessica Bullock is a 85 y.o. female seen for follow up of Acute on Chronic Respiratory Failure.  Patient remains on 2 L of oxygen via nasal cannula.  Patient remains afebrile no distress noted at this time.    Medications: Reviewed on Rounds  Physical Exam:  Vitals: Pulse 63 respiration 17 BP 144/44 O2 sat 97% temp 7.0  Ventilator Settings 2 L via nasal cannula  . General: Comfortable at this time . Eyes: Grossly normal lids, irises & conjunctiva . ENT: grossly tongue is normal . Neck: no obvious mass . Cardiovascular: S1 S2 normal no gallop . Respiratory: No rales or rhonchi noted . Abdomen: soft . Skin: no rash seen on limited exam . Musculoskeletal: not rigid . Psychiatric:unable to assess . Neurologic: no seizure no involuntary movements         Lab Data:   Basic Metabolic Panel: Recent Labs  Lab 09/22/18 1322 09/25/18 0400 09/26/18 0722  NA 134* 132* 133*  K 4.8 4.5 4.6  CL 98 97* 99  CO2 25 25 25   GLUCOSE 100* 76 87  BUN 25* 22 22  CREATININE 1.23* 1.00 1.04*  CALCIUM 9.4 9.1 9.2    ABG: No results for input(s): PHART, PCO2ART, PO2ART, HCO3, O2SAT in the last 168 hours.  Liver Function Tests: No results for input(s): AST, ALT, ALKPHOS, BILITOT, PROT, ALBUMIN in the last 168 hours. No results for input(s): LIPASE, AMYLASE in the last 168 hours. No results for input(s): AMMONIA in the last 168 hours.  CBC: No results for input(s): WBC, NEUTROABS, HGB, HCT, MCV, PLT in the last 168 hours.  Cardiac Enzymes: No results for input(s): CKTOTAL, CKMB, CKMBINDEX, TROPONINI in the last 168 hours.  BNP (last 3 results) No results for input(s): BNP in the last 8760 hours.  ProBNP (last 3  results) No results for input(s): PROBNP in the last 8760 hours.  Radiological Exams: No results found.  Assessment/Plan Active Problems:   Acute on chronic respiratory failure with hypoxia (HCC)   Aspiration pneumonia due to gastric secretions (HCC)   Acute metabolic encephalopathy   Parkinson's disease (tremor, stiffness, slow motion, unstable posture) (HCC)   Chronic diastolic heart failure (HCC)   1. Acute on chronic respiratory failure with hypoxia patient will continue with 2 L at this time.  May titrate oxygen as tolerated.  Continue present management pulmonary toilet 2. Aspiration pneumonia treated continue to monitor 3. Acute metabolic encephalopathy improved at this time 4. Acute renal failure continue to follow 5. Parkinson's disease advanced disease continue supportive measures 6. Chronic systolic heart failure monitor fluid status and use of diuretics   I have personally seen and evaluated the patient, evaluated laboratory and imaging results, formulated the assessment and plan and placed orders. The Patient requires high complexity decision making for assessment and support.  Case was discussed on Rounds with the Respiratory Therapy Staff  Yevonne Pax, MD [facility] Pulmonary Critical Care Medicine Sleep Medicine

## 2018-09-27 DIAGNOSIS — I5032 Chronic diastolic (congestive) heart failure: Secondary | ICD-10-CM | POA: Diagnosis not present

## 2018-09-27 DIAGNOSIS — J9621 Acute and chronic respiratory failure with hypoxia: Secondary | ICD-10-CM | POA: Diagnosis not present

## 2018-09-27 DIAGNOSIS — J690 Pneumonitis due to inhalation of food and vomit: Secondary | ICD-10-CM | POA: Diagnosis not present

## 2018-09-27 DIAGNOSIS — G9341 Metabolic encephalopathy: Secondary | ICD-10-CM | POA: Diagnosis not present

## 2018-09-27 NOTE — Progress Notes (Addendum)
Pulmonary Critical Care Medicine [facility] [place]   PULMONARY CRITICAL CARE SERVICE  PROGRESS NOTE  Date of Service: 09/27/2018  Jessica Bullock  [id number]  DOB: 1929-06-22   DOA: 09/16/2018  Referring Physician: Carron Curie, MD  HPI: Jessica Bullock is a 85 y.o. female seen for follow up of Acute on Chronic Respiratory Failure.  Patient continued on 2 L of oxygen via nasal cannula with no acute distress at this time.  Patient remains afebrile.  Medications: Reviewed on Rounds  Physical Exam:  Vitals: Pulse 64 respirations 20 BP 94/68 O2 sat 99% temp 97.0  Ventilator Settings 2 L nasal cannula  . General: Comfortable at this time . Eyes: Grossly normal lids, irises & conjunctiva . ENT: grossly tongue is normal . Neck: no obvious mass . Cardiovascular: S1 S2 normal no gallop . Respiratory: No rales or rhonchi noted . Abdomen: soft . Skin: no rash seen on limited exam . Musculoskeletal: not rigid . Psychiatric:unable to assess . Neurologic: no seizure no involuntary movements         Lab Data:   Basic Metabolic Panel: Recent Labs  Lab 09/22/18 1322 09/25/18 0400 09/26/18 0722  NA 134* 132* 133*  K 4.8 4.5 4.6  CL 98 97* 99  CO2 25 25 25   GLUCOSE 100* 76 87  BUN 25* 22 22  CREATININE 1.23* 1.00 1.04*  CALCIUM 9.4 9.1 9.2    ABG: No results for input(s): PHART, PCO2ART, PO2ART, HCO3, O2SAT in the last 168 hours.  Liver Function Tests: No results for input(s): AST, ALT, ALKPHOS, BILITOT, PROT, ALBUMIN in the last 168 hours. No results for input(s): LIPASE, AMYLASE in the last 168 hours. No results for input(s): AMMONIA in the last 168 hours.  CBC: No results for input(s): WBC, NEUTROABS, HGB, HCT, MCV, PLT in the last 168 hours.  Cardiac Enzymes: No results for input(s): CKTOTAL, CKMB, CKMBINDEX, TROPONINI in the last 168 hours.  BNP (last 3 results) No results for input(s): BNP in the last 8760 hours.  ProBNP (last 3  results) No results for input(s): PROBNP in the last 8760 hours.  Radiological Exams: No results found.  Assessment/Plan Active Problems:   Acute on chronic respiratory failure with hypoxia (HCC)   Aspiration pneumonia due to gastric secretions (HCC)   Acute metabolic encephalopathy   Parkinson's disease (tremor, stiffness, slow motion, unstable posture) (HCC)   Chronic diastolic heart failure (HCC)   1. Acute on chronic respiratory failure with hypoxia continue with 2 L at this time and titrate oxygen as tolerated.  Continue pulmonary toilet and secretion management 2. Aspiration pneumonia treated continue to monitor 3. Acute metabolic encephalopathy improved at this time 4. Acute renal failure continue to follow 5. Parkinson's disease advanced disease continue supportive measures 6. Chronic systolic heart failure monitor fluid status and use of diuretics   I have personally seen and evaluated the patient, evaluated laboratory and imaging results, formulated the assessment and plan and placed orders. The Patient requires high complexity decision making for assessment and support.  Case was discussed on Rounds with the Respiratory Therapy Staff  Yevonne Pax, MD [facility] Pulmonary Critical Care Medicine Sleep Medicine

## 2018-09-28 DIAGNOSIS — I5032 Chronic diastolic (congestive) heart failure: Secondary | ICD-10-CM | POA: Diagnosis not present

## 2018-09-28 DIAGNOSIS — J9621 Acute and chronic respiratory failure with hypoxia: Secondary | ICD-10-CM | POA: Diagnosis not present

## 2018-09-28 DIAGNOSIS — J690 Pneumonitis due to inhalation of food and vomit: Secondary | ICD-10-CM | POA: Diagnosis not present

## 2018-09-28 DIAGNOSIS — G9341 Metabolic encephalopathy: Secondary | ICD-10-CM | POA: Diagnosis not present

## 2018-09-28 NOTE — Progress Notes (Addendum)
Pulmonary Critical Care Medicine [facility] [place]   PULMONARY CRITICAL CARE SERVICE  PROGRESS NOTE  Date of Service: 09/28/2018  Jessica Bullock  [id number]  DOB: [birth year]   DOA: 09/16/2018  Referring Physician: Carron Curie, MD  HPI: Jessica Bullock is a 85 y.o. female seen for follow up of Acute on Chronic Respiratory Failure.  Patient was on 2 L of oxygen via nasal cannula.  Also afebrile with no distress at this time.  Medications: Reviewed on Rounds  Physical Exam:  Vitals: Pulse 71 respirations 20 BP 156 O2 sat 99% temp 96  Ventilator Settings 2 L nasal cannula  . General: Comfortable at this time . Eyes: Grossly normal lids, irises & conjunctiva . ENT: grossly tongue is normal . Neck: no obvious mass . Cardiovascular: S1 S2 normal no gallop . Respiratory: No rales or rhonchi noted . Abdomen: soft . Skin: no rash seen on limited exam . Musculoskeletal: not rigid . Psychiatric:unable to assess . Neurologic: no seizure no involuntary movements         Lab Data:   Basic Metabolic Panel: Recent Labs  Lab 09/22/18 1322 09/25/18 0400 09/26/18 0722  NA 134* 132* 133*  K 4.8 4.5 4.6  CL 98 97* 99  CO2 25 25 25   GLUCOSE 100* 76 87  BUN 25* 22 22  CREATININE 1.23* 1.00 1.04*  CALCIUM 9.4 9.1 9.2    ABG: No results for input(s): PHART, PCO2ART, PO2ART, HCO3, O2SAT in the last 168 hours.  Liver Function Tests: No results for input(s): AST, ALT, ALKPHOS, BILITOT, PROT, ALBUMIN in the last 168 hours. No results for input(s): LIPASE, AMYLASE in the last 168 hours. No results for input(s): AMMONIA in the last 168 hours.  CBC: No results for input(s): WBC, NEUTROABS, HGB, HCT, MCV, PLT in the last 168 hours.  Cardiac Enzymes: No results for input(s): CKTOTAL, CKMB, CKMBINDEX, TROPONINI in the last 168 hours.  BNP (last 3 results) No results for input(s): BNP in the last 8760 hours.  ProBNP (last 3 results) No results for input(s):  PROBNP in the last 8760 hours.  Radiological Exams: No results found.  Assessment/Plan Active Problems:   Acute on chronic respiratory failure with hypoxia (HCC)   Aspiration pneumonia due to gastric secretions (HCC)   Acute metabolic encephalopathy   Parkinson's disease (tremor, stiffness, slow motion, unstable posture) (HCC)   Chronic diastolic heart failure (HCC)   1. Acute on chronic respiratory failure with hypoxia continue with 2 L and continue to titrate oxygen as tolerated.  Continue secretion management pulmonary toilet 2. Aspiration pneumonia treated continue to monitor 3. Acute metabolic encephalopathy improved at this time 4. Acute renal failure continue to follow 5. Parkinson's disease advanced disease continue supportive measures 6. Consult heart failure continue to monitor fluid status with diuretics   I have personally seen and evaluated the patient, evaluated laboratory and imaging results, formulated the assessment and plan and placed orders. The Patient requires high complexity decision making for assessment and support.  Case was discussed on Rounds with the Respiratory Therapy Staff  Yevonne Pax, MD [facility] Pulmonary Critical Care Medicine Sleep Medicine

## 2018-09-29 DIAGNOSIS — J9621 Acute and chronic respiratory failure with hypoxia: Secondary | ICD-10-CM | POA: Diagnosis not present

## 2018-09-29 DIAGNOSIS — J690 Pneumonitis due to inhalation of food and vomit: Secondary | ICD-10-CM | POA: Diagnosis not present

## 2018-09-29 DIAGNOSIS — G9341 Metabolic encephalopathy: Secondary | ICD-10-CM | POA: Diagnosis not present

## 2018-09-29 DIAGNOSIS — I5032 Chronic diastolic (congestive) heart failure: Secondary | ICD-10-CM | POA: Diagnosis not present

## 2018-09-29 NOTE — Progress Notes (Addendum)
Pulmonary Critical Care Medicine [facility] [place]   PULMONARY CRITICAL CARE SERVICE  PROGRESS NOTE  Date of Service: 09/29/2018  Jessica Bullock  [id number]  DOB: [birth year]   DOA: 09/16/2018  Referring Physician: Carron Curie, MD  HPI: Jessica Bullock is a 85 y.o. female seen for follow up of Acute on Chronic Respiratory Failure.  Patient remains on 2 L.  Respiratory therapy to wean patient down to room air however she could not tolerate this became slightly hypoxic.  Medications: Reviewed on Rounds  Physical Exam:  Vitals: Pulse 82 respirations 18 BP 141/65 O2 sat 99% temp 97.4  Ventilator Settings 2 L via nasal cannula  . General: Comfortable at this time . Eyes: Grossly normal lids, irises & conjunctiva . ENT: grossly tongue is normal . Neck: no obvious mass . Cardiovascular: S1 S2 normal no gallop . Respiratory: No rales or rhonchi noted . Abdomen: soft . Skin: no rash seen on limited exam . Musculoskeletal: not rigid . Psychiatric:unable to assess . Neurologic: no seizure no involuntary movements         Lab Data:   Basic Metabolic Panel: Recent Labs  Lab 09/25/18 0400 09/26/18 0722  NA 132* 133*  K 4.5 4.6  CL 97* 99  CO2 25 25  GLUCOSE 76 87  BUN 22 22  CREATININE 1.00 1.04*  CALCIUM 9.1 9.2    ABG: No results for input(s): PHART, PCO2ART, PO2ART, HCO3, O2SAT in the last 168 hours.  Liver Function Tests: No results for input(s): AST, ALT, ALKPHOS, BILITOT, PROT, ALBUMIN in the last 168 hours. No results for input(s): LIPASE, AMYLASE in the last 168 hours. No results for input(s): AMMONIA in the last 168 hours.  CBC: No results for input(s): WBC, NEUTROABS, HGB, HCT, MCV, PLT in the last 168 hours.  Cardiac Enzymes: No results for input(s): CKTOTAL, CKMB, CKMBINDEX, TROPONINI in the last 168 hours.  BNP (last 3 results) No results for input(s): BNP in the last 8760 hours.  ProBNP (last 3 results) No results for  input(s): PROBNP in the last 8760 hours.  Radiological Exams: No results found.  Assessment/Plan Active Problems:   Acute on chronic respiratory failure with hypoxia (HCC)   Aspiration pneumonia due to gastric secretions (HCC)   Acute metabolic encephalopathy   Parkinson's disease (tremor, stiffness, slow motion, unstable posture) (HCC)   Chronic diastolic heart failure (HCC)   1. Acute on chronic respiratory failure with hypoxia continue with 2 L and attempt to titrate as tolerated.  Continue pulmonary toilet and secretion management 2. Aspiration pneumonia treated continue to monitor 3. Acute metabolic encephalopathy improved at this time 4. Acute renal failure continue to follow 5. Parkinson's disease advanced disease continue supportive measures 6. Congestive heart failure continue to monitor fluid status and diuretics   I have personally seen and evaluated the patient, evaluated laboratory and imaging results, formulated the assessment and plan and placed orders. The Patient requires high complexity decision making for assessment and support.  Case was discussed on Rounds with the Respiratory Therapy Staff  Yevonne Pax, MD [facility] Pulmonary Critical Care Medicine Sleep Medicine

## 2018-09-30 DIAGNOSIS — I5032 Chronic diastolic (congestive) heart failure: Secondary | ICD-10-CM | POA: Diagnosis not present

## 2018-09-30 DIAGNOSIS — J690 Pneumonitis due to inhalation of food and vomit: Secondary | ICD-10-CM | POA: Diagnosis not present

## 2018-09-30 DIAGNOSIS — J9621 Acute and chronic respiratory failure with hypoxia: Secondary | ICD-10-CM | POA: Diagnosis not present

## 2018-09-30 DIAGNOSIS — G9341 Metabolic encephalopathy: Secondary | ICD-10-CM | POA: Diagnosis not present

## 2018-09-30 LAB — BASIC METABOLIC PANEL
Anion gap: 10 (ref 5–15)
BUN: 35 mg/dL — ABNORMAL HIGH (ref 8–23)
CO2: 25 mmol/L (ref 22–32)
Calcium: 9.3 mg/dL (ref 8.9–10.3)
Chloride: 101 mmol/L (ref 98–111)
Creatinine, Ser: 1.18 mg/dL — ABNORMAL HIGH (ref 0.44–1.00)
GFR calc Af Amer: 48 mL/min — ABNORMAL LOW (ref >60)
GFR calc non Af Amer: 41 mL/min — ABNORMAL LOW (ref >60)
Glucose, Bld: 80 mg/dL (ref 70–99)
Potassium: 4.5 mmol/L (ref 3.5–5.1)
Sodium: 136 mmol/L (ref 135–145)

## 2018-09-30 LAB — CBC
HCT: 28.1 % — ABNORMAL LOW (ref 36.0–46.0)
Hemoglobin: 8.6 g/dL — ABNORMAL LOW (ref 12.0–15.0)
MCH: 30 pg (ref 26.0–34.0)
MCHC: 30.6 g/dL (ref 30.0–36.0)
MCV: 97.9 fL (ref 80.0–100.0)
Platelets: 255 10*3/uL (ref 150–400)
RBC: 2.87 MIL/uL — ABNORMAL LOW (ref 3.87–5.11)
RDW: 14.6 % (ref 11.5–15.5)
WBC: 4.9 10*3/uL (ref 4.0–10.5)
nRBC: 0 % (ref 0.0–0.2)

## 2018-09-30 LAB — TSH: TSH: 2.199 u[IU]/mL (ref 0.350–4.500)

## 2018-09-30 NOTE — Progress Notes (Addendum)
Pulmonary Critical Care Medicine [facility] [place]   PULMONARY CRITICAL CARE SERVICE  PROGRESS NOTE  Date of Service: 09/30/2018  Jessica Bullock  [id number]  DOB: 10-12-1929   DOA: 09/16/2018  Referring Physician: Carron Curie, MD  HPI: Jessica Bullock is a 85 y.o. female seen for follow up of Acute on Chronic Respiratory Failure.  Patient remains on 2 L of oxygen via nasal cannula.  No acute distress is noted.  Patient is satting well.  Medications: Reviewed on Rounds  Physical Exam:  Vitals: Pulse 82 respirations 20 BP 106/62 O2 sat 97% temp 96.2  Ventilator Settings 2 L nasal cannula  . General: Comfortable at this time . Eyes: Grossly normal lids, irises & conjunctiva . ENT: grossly tongue is normal . Neck: no obvious mass . Cardiovascular: S1 S2 normal no gallop . Respiratory: No rales or rhonchi noted . Abdomen: soft . Skin: no rash seen on limited exam . Musculoskeletal: not rigid . Psychiatric:unable to assess . Neurologic: no seizure no involuntary movements         Lab Data:   Basic Metabolic Panel: Recent Labs  Lab 09/25/18 0400 09/26/18 0722 09/30/18 0549  NA 132* 133* 136  K 4.5 4.6 4.5  CL 97* 99 101  CO2 25 25 25   GLUCOSE 76 87 80  BUN 22 22 35*  CREATININE 1.00 1.04* 1.18*  CALCIUM 9.1 9.2 9.3    ABG: No results for input(s): PHART, PCO2ART, PO2ART, HCO3, O2SAT in the last 168 hours.  Liver Function Tests: No results for input(s): AST, ALT, ALKPHOS, BILITOT, PROT, ALBUMIN in the last 168 hours. No results for input(s): LIPASE, AMYLASE in the last 168 hours. No results for input(s): AMMONIA in the last 168 hours.  CBC: Recent Labs  Lab 09/30/18 0549  WBC 4.9  HGB 8.6*  HCT 28.1*  MCV 97.9  PLT 255    Cardiac Enzymes: No results for input(s): CKTOTAL, CKMB, CKMBINDEX, TROPONINI in the last 168 hours.  BNP (last 3 results) No results for input(s): BNP in the last 8760 hours.  ProBNP (last 3 results) No  results for input(s): PROBNP in the last 8760 hours.  Radiological Exams: No results found.  Assessment/Plan Active Problems:   Acute on chronic respiratory failure with hypoxia (HCC)   Aspiration pneumonia due to gastric secretions (HCC)   Acute metabolic encephalopathy   Parkinson's disease (tremor, stiffness, slow motion, unstable posture) (HCC)   Chronic diastolic heart failure (HCC)   1. Acute on chronic respiratory failure with hypoxia continue with 2 L and attempt to titrate as tolerated.  Continue secretion management pulmonary toilet and supportive measures 2. Aspiration pneumonia treated continue to monitor 3. Acute metabolic encephalopathy improved at this time 4. Acute renal failure continue to follow 5. Parkinson's disease advanced disease continue supportive measures 6. Congestive heart failure continue to monitor fluid status and diuretics   I have personally seen and evaluated the patient, evaluated laboratory and imaging results, formulated the assessment and plan and placed orders. The Patient requires high complexity decision making for assessment and support.  Case was discussed on Rounds with the Respiratory Therapy Staff  Yevonne Pax, MD [facility] Pulmonary Critical Care Medicine Sleep Medicine

## 2018-10-01 DIAGNOSIS — J690 Pneumonitis due to inhalation of food and vomit: Secondary | ICD-10-CM | POA: Diagnosis not present

## 2018-10-01 DIAGNOSIS — G9341 Metabolic encephalopathy: Secondary | ICD-10-CM | POA: Diagnosis not present

## 2018-10-01 DIAGNOSIS — I5032 Chronic diastolic (congestive) heart failure: Secondary | ICD-10-CM | POA: Diagnosis not present

## 2018-10-01 DIAGNOSIS — J9621 Acute and chronic respiratory failure with hypoxia: Secondary | ICD-10-CM | POA: Diagnosis not present

## 2018-10-01 NOTE — Progress Notes (Addendum)
Pulmonary Critical Care Medicine [facility] [place]   PULMONARY CRITICAL CARE SERVICE  PROGRESS NOTE  Date of Service: 10/01/2018  Jessica Bullock  TTS:[id number]  DOB: 12/29/1929   DOA: 09/16/2018  Referring Physician: Carron Curie, MD  HPI: Jessica Bullock is a 85 y.o. female seen for follow up of Acute on Chronic Respiratory Failure.  Patient continues on room air during the day and requires 2 L of oxygen via nasal cannula at night.  Currently resting with good saturations and no acute distress at this time.  Medications: Reviewed on Rounds  Physical Exam:  Vitals: Pulse 63 respiration 18 BP 134/70 O2 sat 90% temp 97.7  Ventilator Settings 2 L nasal cannula  . General: Comfortable at this time . Eyes: Grossly normal lids, irises & conjunctiva . ENT: grossly tongue is normal . Neck: no obvious mass . Cardiovascular: S1 S2 normal no gallop . Respiratory: No rales or rhonchi noted . Abdomen: soft . Skin: no rash seen on limited exam . Musculoskeletal: not rigid . Psychiatric:unable to assess . Neurologic: no seizure no involuntary movements         Lab Data:   Basic Metabolic Panel: Recent Labs  Lab 09/25/18 0400 09/26/18 0722 09/30/18 0549  NA 132* 133* 136  K 4.5 4.6 4.5  CL 97* 99 101  CO2 25 25 25   GLUCOSE 76 87 80  BUN 22 22 35*  CREATININE 1.00 1.04* 1.18*  CALCIUM 9.1 9.2 9.3    ABG: No results for input(s): PHART, PCO2ART, PO2ART, HCO3, O2SAT in the last 168 hours.  Liver Function Tests: No results for input(s): AST, ALT, ALKPHOS, BILITOT, PROT, ALBUMIN in the last 168 hours. No results for input(s): LIPASE, AMYLASE in the last 168 hours. No results for input(s): AMMONIA in the last 168 hours.  CBC: Recent Labs  Lab 09/30/18 0549  WBC 4.9  HGB 8.6*  HCT 28.1*  MCV 97.9  PLT 255    Cardiac Enzymes: No results for input(s): CKTOTAL, CKMB, CKMBINDEX, TROPONINI in the last 168 hours.  BNP (last 3 results) No results for  input(s): BNP in the last 8760 hours.  ProBNP (last 3 results) No results for input(s): PROBNP in the last 8760 hours.  Radiological Exams: No results found.  Assessment/Plan Active Problems:   Acute on chronic respiratory failure with hypoxia (HCC)   Aspiration pneumonia due to gastric secretions (HCC)   Acute metabolic encephalopathy   Parkinson's disease (tremor, stiffness, slow motion, unstable posture) (HCC)   Chronic diastolic heart failure (HCC)   1. Acute on chronic respiratory failure with hypoxia continue with 2 L of oxygen continue to attempt titration.  Continue pulmonary toilet and secretion management as well as supportive measures 2. Aspiration pneumonia treated continue to monitor 3. Acute metabolic encephalopathy improved 4. Acute renal failure continue to follow 5. Parkinson's disease advanced age continue supportive measures 6. Congestive heart failure continue to monitor fluid status   I have personally seen and evaluated the patient, evaluated laboratory and imaging results, formulated the assessment and plan and placed orders. The Patient requires high complexity decision making for assessment and support.  Case was discussed on Rounds with the Respiratory Therapy Staff  Yevonne Pax, MD [facility] Pulmonary Critical Care Medicine Sleep Medicine

## 2018-10-02 DIAGNOSIS — J690 Pneumonitis due to inhalation of food and vomit: Secondary | ICD-10-CM | POA: Diagnosis not present

## 2018-10-02 DIAGNOSIS — I5032 Chronic diastolic (congestive) heart failure: Secondary | ICD-10-CM | POA: Diagnosis not present

## 2018-10-02 DIAGNOSIS — J9621 Acute and chronic respiratory failure with hypoxia: Secondary | ICD-10-CM | POA: Diagnosis not present

## 2018-10-02 DIAGNOSIS — G9341 Metabolic encephalopathy: Secondary | ICD-10-CM | POA: Diagnosis not present

## 2018-10-02 LAB — URINALYSIS, ROUTINE W REFLEX MICROSCOPIC
Bilirubin Urine: NEGATIVE
Glucose, UA: NEGATIVE mg/dL
Ketones, ur: NEGATIVE mg/dL
Nitrite: NEGATIVE
Protein, ur: NEGATIVE mg/dL
Specific Gravity, Urine: 1.012 (ref 1.005–1.030)
WBC, UA: >50 WBC/hpf — ABNORMAL HIGH (ref 0–5)
pH: 7 (ref 5.0–8.0)

## 2018-10-02 NOTE — Progress Notes (Addendum)
Pulmonary Critical Care Medicine [facility] [place]   PULMONARY CRITICAL CARE SERVICE  PROGRESS NOTE  Date of Service: 10/02/2018  Jessica Bullock  [id number]  DOB: 1929-10-03   DOA: 09/16/2018  Referring Physician: Carron Curie, MD  HPI: Jessica Bullock is a 85 y.o. female seen for follow up of Acute on Chronic Respiratory Failure.  Patient remains overnight on 2 L oxygen at night due to desaturations.  Currently doing well with no acute distress.  Medications: Reviewed on Rounds  Physical Exam:  Vitals: Pulse 73 respirations 18 BP BP 121/64 O2 sat 94% 96.4  Ventilator Settings 2 L nasal cannula  . General: Comfortable at this time . Eyes: Grossly normal lids, irises & conjunctiva . ENT: grossly tongue is normal . Neck: no obvious mass . Cardiovascular: S1 S2 normal no gallop . Respiratory: No rales or rhonchi noted . Abdomen: soft . Skin: no rash seen on limited exam . Musculoskeletal: not rigid . Psychiatric:unable to assess . Neurologic: no seizure no involuntary movements         Lab Data:   Basic Metabolic Panel: Recent Labs  Lab 09/26/18 0722 09/30/18 0549  NA 133* 136  K 4.6 4.5  CL 99 101  CO2 25 25  GLUCOSE 87 80  BUN 22 35*  CREATININE 1.04* 1.18*  CALCIUM 9.2 9.3    ABG: No results for input(s): PHART, PCO2ART, PO2ART, HCO3, O2SAT in the last 168 hours.  Liver Function Tests: No results for input(s): AST, ALT, ALKPHOS, BILITOT, PROT, ALBUMIN in the last 168 hours. No results for input(s): LIPASE, AMYLASE in the last 168 hours. No results for input(s): AMMONIA in the last 168 hours.  CBC: Recent Labs  Lab 09/30/18 0549  WBC 4.9  HGB 8.6*  HCT 28.1*  MCV 97.9  PLT 255    Cardiac Enzymes: No results for input(s): CKTOTAL, CKMB, CKMBINDEX, TROPONINI in the last 168 hours.  BNP (last 3 results) No results for input(s): BNP in the last 8760 hours.  ProBNP (last 3 results) No results for input(s): PROBNP in the  last 8760 hours.  Radiological Exams: No results found.  Assessment/Plan Active Problems:   Acute on chronic respiratory failure with hypoxia (HCC)   Aspiration pneumonia due to gastric secretions (HCC)   Acute metabolic encephalopathy   Parkinson's disease (tremor, stiffness, slow motion, unstable posture) (HCC)   Chronic diastolic heart failure (HCC)   1. Acute on chronic respiratory related hypoxia continue with room air during the day and 2 L at night as needed.  Continue supportive measures 2. Aspiration pneumonia treated continue to monitor 3. Acute metabolic encephalopathy improved 4. Acute renal failure continue to follow 5. Parkinson's disease advanced disease continue supportive measures 6. Congestive heart failure continue to monitor fluid status   I have personally seen and evaluated the patient, evaluated laboratory and imaging results, formulated the assessment and plan and placed orders. The Patient requires high complexity decision making for assessment and support.  Case was discussed on Rounds with the Respiratory Therapy Staff  Yevonne Pax, MD [facility] Pulmonary Critical Care Medicine Sleep Medicine

## 2018-10-03 DIAGNOSIS — J9621 Acute and chronic respiratory failure with hypoxia: Secondary | ICD-10-CM | POA: Diagnosis not present

## 2018-10-03 DIAGNOSIS — J690 Pneumonitis due to inhalation of food and vomit: Secondary | ICD-10-CM | POA: Diagnosis not present

## 2018-10-03 DIAGNOSIS — I5032 Chronic diastolic (congestive) heart failure: Secondary | ICD-10-CM | POA: Diagnosis not present

## 2018-10-03 DIAGNOSIS — G9341 Metabolic encephalopathy: Secondary | ICD-10-CM | POA: Diagnosis not present

## 2018-10-03 NOTE — Progress Notes (Addendum)
Pulmonary Critical Care Medicine [facility] [place]   PULMONARY CRITICAL CARE SERVICE  PROGRESS NOTE  Date of Service: 10/03/2018  Coralis Trusty  [id number]  DOB: [birth year]   DOA: 09/16/2018  Referring Physician: Carron Curie, MD  HPI: Cheryl Lesnik is a 85 y.o. female seen for follow up of Acute on Chronic Respiratory Failure.  Patient remains on room air at time with no acute distress noted.  Patient is doing well with O2 saturations during the day.  Does have slight desaturation at night and required 2 L of oxygen intermittently.  Medications: Reviewed on Rounds  Physical Exam:  Vitals: Pulse 97 respirations 24 BP 119/59 O2 sat 84% temp 96.4  Ventilator Settings not currently on ventilator  . General: Comfortable at this time . Eyes: Grossly normal lids, irises & conjunctiva . ENT: grossly tongue is normal . Neck: no obvious mass . Cardiovascular: S1 S2 normal no gallop . Respiratory: No rales or rhonchi noted . Abdomen: soft . Skin: no rash seen on limited exam . Musculoskeletal: not rigid . Psychiatric:unable to assess . Neurologic: no seizure no involuntary movements         Lab Data:   Basic Metabolic Panel: Recent Labs  Lab 09/30/18 0549  NA 136  K 4.5  CL 101  CO2 25  GLUCOSE 80  BUN 35*  CREATININE 1.18*  CALCIUM 9.3    ABG: No results for input(s): PHART, PCO2ART, PO2ART, HCO3, O2SAT in the last 168 hours.  Liver Function Tests: No results for input(s): AST, ALT, ALKPHOS, BILITOT, PROT, ALBUMIN in the last 168 hours. No results for input(s): LIPASE, AMYLASE in the last 168 hours. No results for input(s): AMMONIA in the last 168 hours.  CBC: Recent Labs  Lab 09/30/18 0549  WBC 4.9  HGB 8.6*  HCT 28.1*  MCV 97.9  PLT 255    Cardiac Enzymes: No results for input(s): CKTOTAL, CKMB, CKMBINDEX, TROPONINI in the last 168 hours.  BNP (last 3 results) No results for input(s): BNP in the last 8760 hours.  ProBNP  (last 3 results) No results for input(s): PROBNP in the last 8760 hours.  Radiological Exams: No results found.  Assessment/Plan Active Problems:   Acute on chronic respiratory failure with hypoxia (HCC)   Aspiration pneumonia due to gastric secretions (HCC)   Acute metabolic encephalopathy   Parkinson's disease (tremor, stiffness, slow motion, unstable posture) (HCC)   Chronic diastolic heart failure (HCC)   1. Acute on chronic respiratory failure with hypoxia continue with room air during the day and 2 L at night as needed.  Continue supportive measures and pulmonary toilet 2. Operation pneumonia treated continue to monitor 3. Acute metabolic encephalopathy improved 4. Acute renal failure continue to follow 5. Parkinson's disease advanced disease continue supportive measures 6. Congestive heart failure continue to monitor fluid status   I have personally seen and evaluated the patient, evaluated laboratory and imaging results, formulated the assessment and plan and placed orders. The Patient requires high complexity decision making for assessment and support.  Case was discussed on Rounds with the Respiratory Therapy Staff  Yevonne Pax, MD [facility] Pulmonary Critical Care Medicine Sleep Medicine

## 2018-10-04 DIAGNOSIS — J9621 Acute and chronic respiratory failure with hypoxia: Secondary | ICD-10-CM | POA: Diagnosis not present

## 2018-10-04 DIAGNOSIS — J690 Pneumonitis due to inhalation of food and vomit: Secondary | ICD-10-CM | POA: Diagnosis not present

## 2018-10-04 DIAGNOSIS — G9341 Metabolic encephalopathy: Secondary | ICD-10-CM | POA: Diagnosis not present

## 2018-10-04 DIAGNOSIS — I5032 Chronic diastolic (congestive) heart failure: Secondary | ICD-10-CM | POA: Diagnosis not present

## 2018-10-04 LAB — SARS CORONAVIRUS 2 BY RT PCR (HOSPITAL ORDER, PERFORMED IN ~~LOC~~ HOSPITAL LAB): SARS Coronavirus 2: NEGATIVE

## 2018-10-04 NOTE — Progress Notes (Addendum)
Pulmonary Critical Care Medicine [facility] [place]   PULMONARY CRITICAL CARE SERVICE  PROGRESS NOTE  Date of Service: 10/04/2018  Jessica Bullock  [id number]:[id number]  DOB: 1930-02-23   DOA: 09/16/2018  Referring Physician: Carron Curie, MD  HPI: Jessica Bullock is a 85 y.o. female seen for follow up of Acute on Chronic Respiratory Failure.  Patient remains on room air at this time.  Intermittently to use 2 L of oxygen at night for desaturations.  There is no distress noted at this time the patient has good saturations.  Medications: Reviewed on Rounds  Physical Exam:  Vitals: Pulse 101 respirations 15 BP 113/63 O2 sat 96% temp 96.3  Ventilator Settings room air  . General: Comfortable at this time . Eyes: Grossly normal lids, irises & conjunctiva . ENT: grossly tongue is normal . Neck: Full no obvious mass . Cardiovascular: S1 S2 normal no gallop . Respiratory: No rales or rhonchi noted . Abdomen: soft . Skin: no rash seen on limited exam . Musculoskeletal: not rigid . Psychiatric:unable to assess . Neurologic: no seizure no involuntary movements         Lab Data:   Basic Metabolic Panel: Recent Labs  Lab 09/30/18 0549  NA 136  K 4.5  CL 101  CO2 25  GLUCOSE 80  BUN 35*  CREATININE 1.18*  CALCIUM 9.3    ABG: No results for input(s): PHART, PCO2ART, PO2ART, HCO3, O2SAT in the last 168 hours.  Liver Function Tests: No results for input(s): AST, ALT, ALKPHOS, BILITOT, PROT, ALBUMIN in the last 168 hours. No results for input(s): LIPASE, AMYLASE in the last 168 hours. No results for input(s): AMMONIA in the last 168 hours.  CBC: Recent Labs  Lab 09/30/18 0549  WBC 4.9  HGB 8.6*  HCT 28.1*  MCV 97.9  PLT 255    Cardiac Enzymes: No results for input(s): CKTOTAL, CKMB, CKMBINDEX, TROPONINI in the last 168 hours.  BNP (last 3 results) No results for input(s): BNP in the last 8760 hours.  ProBNP (last 3 results) No results for  input(s): PROBNP in the last 8760 hours.  Radiological Exams: No results found.  Assessment/Plan Active Problems:   Acute on chronic respiratory failure with hypoxia (HCC)   Aspiration pneumonia due to gastric secretions (HCC)   Acute metabolic encephalopathy   Parkinson's disease (tremor, stiffness, slow motion, unstable posture) (HCC)   Chronic diastolic heart failure (HCC)   1. Acute on chronic respiratory failure with hypoxia continue with room air during the day and using 2 L at night as needed for desaturations.  Continue pulmonary toilet and secretion management 2. Aspiration pneumonia treated continue to monitor 3. Acute metabolic encephalopathy improved 4. Acute renal failure continue to follow 5. Parkinson's disease advanced disease continue supportive measures 6. Congestive heart failure continue to monitor fluid status    I have personally seen and evaluated the patient, evaluated laboratory and imaging results, formulated the assessment and plan and placed orders. The Patient requires high complexity decision making for assessment and support.  Case was discussed on Rounds with the Respiratory Therapy Staff  Yevonne Pax, MD [facility] Pulmonary Critical Care Medicine Sleep Medicine

## 2018-10-06 LAB — URINE CULTURE: Culture: >=100000 — AB

## 2018-10-26 ENCOUNTER — Inpatient Hospital Stay
Admission: AD | Admit: 2018-10-26 | Discharge: 2018-11-13 | Disposition: A | Discharge status: Skilled Nursing Facility | Payer: Medicare Other | Source: Other Acute Inpatient Hospital | Attending: Internal Medicine | Admitting: Internal Medicine

## 2018-10-26 DIAGNOSIS — J9621 Acute and chronic respiratory failure with hypoxia: Secondary | ICD-10-CM | POA: Diagnosis present

## 2018-10-26 DIAGNOSIS — G9341 Metabolic encephalopathy: Secondary | ICD-10-CM | POA: Diagnosis present

## 2018-10-26 DIAGNOSIS — J15212 Pneumonia due to Methicillin resistant Staphylococcus aureus: Secondary | ICD-10-CM

## 2018-10-26 DIAGNOSIS — G20A1 Parkinson's disease without dyskinesia, without mention of fluctuations: Secondary | ICD-10-CM | POA: Diagnosis present

## 2018-10-26 DIAGNOSIS — R0902 Hypoxemia: Secondary | ICD-10-CM

## 2018-10-26 DIAGNOSIS — G20 Parkinson's disease: Secondary | ICD-10-CM | POA: Diagnosis present

## 2018-10-26 DIAGNOSIS — J9690 Respiratory failure, unspecified, unspecified whether with hypoxia or hypercapnia: Secondary | ICD-10-CM

## 2018-10-26 DIAGNOSIS — I5032 Chronic diastolic (congestive) heart failure: Secondary | ICD-10-CM | POA: Diagnosis present

## 2018-10-26 LAB — SARS CORONAVIRUS 2 BY RT PCR (HOSPITAL ORDER, PERFORMED IN ~~LOC~~ HOSPITAL LAB): SARS Coronavirus 2: NEGATIVE

## 2018-10-27 ENCOUNTER — Other Ambulatory Visit (HOSPITAL_COMMUNITY): Payer: Medicare Other

## 2018-10-27 ENCOUNTER — Encounter: Payer: Self-pay | Admitting: Internal Medicine

## 2018-10-27 DIAGNOSIS — I5032 Chronic diastolic (congestive) heart failure: Secondary | ICD-10-CM

## 2018-10-27 DIAGNOSIS — G9341 Metabolic encephalopathy: Secondary | ICD-10-CM

## 2018-10-27 DIAGNOSIS — J9621 Acute and chronic respiratory failure with hypoxia: Secondary | ICD-10-CM | POA: Diagnosis not present

## 2018-10-27 DIAGNOSIS — J15212 Pneumonia due to Methicillin resistant Staphylococcus aureus: Secondary | ICD-10-CM | POA: Diagnosis not present

## 2018-10-27 DIAGNOSIS — G20 Parkinson's disease: Secondary | ICD-10-CM

## 2018-10-27 LAB — COMPREHENSIVE METABOLIC PANEL
ALT: 7 U/L (ref 0–44)
AST: 18 U/L (ref 15–41)
Albumin: 3.4 g/dL — ABNORMAL LOW (ref 3.5–5.0)
Alkaline Phosphatase: 67 U/L (ref 38–126)
Anion gap: 14 (ref 5–15)
BUN: 36 mg/dL — ABNORMAL HIGH (ref 8–23)
CO2: 28 mmol/L (ref 22–32)
Calcium: 9.5 mg/dL (ref 8.9–10.3)
Chloride: 98 mmol/L (ref 98–111)
Creatinine, Ser: 1.13 mg/dL — ABNORMAL HIGH (ref 0.44–1.00)
GFR calc Af Amer: 51 mL/min — ABNORMAL LOW (ref >60)
GFR calc non Af Amer: 44 mL/min — ABNORMAL LOW (ref >60)
Glucose, Bld: 105 mg/dL — ABNORMAL HIGH (ref 70–99)
Potassium: 4.3 mmol/L (ref 3.5–5.1)
Sodium: 140 mmol/L (ref 135–145)
Total Bilirubin: 0.5 mg/dL (ref 0.3–1.2)
Total Protein: 6.6 g/dL (ref 6.5–8.1)

## 2018-10-27 LAB — CBC WITH DIFFERENTIAL/PLATELET
Abs Immature Granulocytes: 0.03 10*3/uL (ref 0.00–0.07)
Basophils Absolute: 0 10*3/uL (ref 0.0–0.1)
Basophils Relative: 0 %
Eosinophils Absolute: 0 10*3/uL (ref 0.0–0.5)
Eosinophils Relative: 0 %
HCT: 29.8 % — ABNORMAL LOW (ref 36.0–46.0)
Hemoglobin: 9.1 g/dL — ABNORMAL LOW (ref 12.0–15.0)
Immature Granulocytes: 1 %
Lymphocytes Relative: 21 %
Lymphs Abs: 0.9 10*3/uL (ref 0.7–4.0)
MCH: 31.1 pg (ref 26.0–34.0)
MCHC: 30.5 g/dL (ref 30.0–36.0)
MCV: 101.7 fL — ABNORMAL HIGH (ref 80.0–100.0)
Monocytes Absolute: 0.2 10*3/uL (ref 0.1–1.0)
Monocytes Relative: 5 %
Neutro Abs: 3 10*3/uL (ref 1.7–7.7)
Neutrophils Relative %: 73 %
Platelets: 349 10*3/uL (ref 150–400)
RBC: 2.93 MIL/uL — ABNORMAL LOW (ref 3.87–5.11)
RDW: 15.9 % — ABNORMAL HIGH (ref 11.5–15.5)
WBC: 4 10*3/uL (ref 4.0–10.5)
nRBC: 0 % (ref 0.0–0.2)

## 2018-10-27 LAB — MAGNESIUM: Magnesium: 2.1 mg/dL (ref 1.7–2.4)

## 2018-10-27 LAB — PROTIME-INR
INR: 1.1 (ref 0.8–1.2)
Prothrombin Time: 13.9 seconds (ref 11.4–15.2)

## 2018-10-27 LAB — TSH: TSH: 3.602 u[IU]/mL (ref 0.350–4.500)

## 2018-10-27 LAB — T4, FREE: Free T4: 0.89 ng/dL (ref 0.82–1.77)

## 2018-10-27 NOTE — Consult Note (Signed)
Pulmonary Critical Care Medicine [facility] [place]  PULMONARY SERVICE  [facility]: [facility]  PULMONARY CRITICAL CARE CONSULT   Jessica BaptistBetty McBride Bullock  ZOX:[id number]  DOB: [birth year]   DOA: 10/26/2018  Referring Physician: Carron CurieAli Hijazi, MD  HPI: Jessica Bullock is a 85 y.o. female seen for follow up of Acute on Chronic Respiratory Failure.  Patient has multiple medical problems including Parkinson's disease hypertension hyperlipidemia hypothyroidism congestive cardiac failure pulmonary hypertension [facility].  Patient was presenting to the hospital on May 2 with altered mental status.  Patient was diagnosed with sepsis and shock.  Diagnosis was made for chemotherapy.urinary tract infection along with MRSA pneumonia.  Patient was started on antibiotics and also was diuresed for congestive heart failure.  Completed the course on 10 May however required ongoing treatment for Candida.  Patient is transferred to our facility for further management and weaning.  Review of Systems:  ROS performed and is unremarkable other than noted above.  Past Medical History:  Diagnosis Date  . Acute metabolic encephalopathy   . Acute on chronic respiratory failure with hypoxia (HCC)   . Aspiration pneumonia due to gastric secretions (HCC)   . Chronic diastolic heart failure (HCC)   . Parkinson's disease (tremor, stiffness, slow motion, unstable posture) (HCC)   Hypertension Hyperlipidemia Hypothyroidism Pulmonary hypertension Chronic kidney disease Chronic anemia  Past surgical history tubal ligation Right hip surgery Total knee replacement Cataract surgery  Social history Negative for tobacco alcohol or drug abuse  Allergies Sulfa  Medications: Reviewed on Rounds  Physical Exam:  Vitals: Temperature 97.0 pulse 64 respiratory 24 blood pressure 155/80 saturations 97%  Ventilator Settings 50% high  flow  . General: Comfortable at this time . Eyes: Grossly normal lids, irises & conjunctiva . ENT: grossly tongue is normal . Neck: no obvious mass . Cardiovascular: S1-S2 normal no gallop no rub is noted . Respiratory: No rhonchi no rales at this time . Abdomen: Soft and nontender . Skin: no rash seen on limited exam . Musculoskeletal: not rigid . Psychiatric:unable to assess . Neurologic: no seizure no involuntary movements         Labs on Admission:  Basic Metabolic Panel: Recent Labs  Lab 10/27/18 0605  NA 140  K 4.3  CL 98  CO2 28  GLUCOSE 105*  BUN 36*  CREATININE 1.13*  CALCIUM 9.5  MG 2.1    No results for input(s): PHART, PCO2ART, PO2ART, HCO3, O2SAT in the last 168 hours.  Liver Function Tests: Recent Labs  Lab 10/27/18 0605  AST 18  ALT 7  ALKPHOS 67  BILITOT 0.5  PROT 6.6  ALBUMIN 3.4*   No results for input(s): LIPASE, AMYLASE in the last 168 hours. No results for input(s): AMMONIA in the last 168 hours.  CBC: Recent Labs  Lab 10/27/18 0605  WBC 4.0  NEUTROABS 3.0  HGB 9.1*  HCT 29.8*  MCV 101.7*  PLT 349    Cardiac Enzymes: No results for input(s): CKTOTAL, CKMB, CKMBINDEX, TROPONINI in the last 168 hours.  BNP (last 3 results) No results for input(s): BNP in the last 8760 hours.  ProBNP (last 3 results) No results for input(s): PROBNP in the last 8760 hours.   Radiological Exams on Admission: Dg Chest Port 1 View  Result Date: 10/27/2018 CLINICAL DATA:  Respiratory failure, hypoxia. EXAM: PORTABLE CHEST 1 VIEW COMPARISON:  None. FINDINGS: Mild cardiomegaly is noted. Probable large hiatal hernia is  noted. Mild loculated left pleural effusion is noted with probable associated atelectasis or infiltrate. Diffuse right lung opacity is noted concerning for edema or pneumonia. Minimal right pleural effusion may be present. Bony thorax is unremarkable. IMPRESSION: Probable large hiatal hernia. Mild loculated left pleural effusion with  adjacent atelectasis or infiltrate. Diffuse right lung opacity is noted concerning for edema or pneumonia. Electronically Signed   By: Lupita Raider M.D.   On: 10/27/2018 07:14    Assessment/Plan Active Problems:   Acute metabolic encephalopathy   Parkinson's disease (tremor, stiffness, slow motion, unstable posture) (HCC)   Chronic diastolic heart failure (HCC)   Acute on chronic respiratory failure with hypoxia (HCC)   MRSA pneumonia (HCC)   1. Acute on chronic respiratory failure with hypoxia at this time patient is on full support which will be continued.  Respiratory therapy will titrate as tolerated patient has been on high flow oxygen requiring 50% FiO2. 2. Severe sepsis patient has been treated with antibiotics patient should be completing Diflucan also on 18 May. 3. MRSA pneumonia as mentioned above treated today with IV vancomycin for MRSA.  We will continue to monitor radiologically.  The last chest x-ray shows mild loculated effusion with some adjacent atelectasis.  There is probably some residual infiltrate also. 4. Parkinson's disease unchanged we will continue with supportive care patient continues to have encephalopathy 5. Chronic diastolic heart failure compensated at this time we will continue with supportive care  I have personally seen and evaluated the patient, evaluated laboratory and imaging results, formulated the assessment and plan and placed orders. The Patient requires high complexity decision making for assessment and support.  Case was discussed on Rounds with the Respiratory Therapy Staff Time Spent  Yevonne Pax, MD [facility] Pulmonary Critical Care Medicine Sleep Medicine

## 2018-10-28 DIAGNOSIS — J9621 Acute and chronic respiratory failure with hypoxia: Secondary | ICD-10-CM | POA: Diagnosis not present

## 2018-10-28 DIAGNOSIS — G9341 Metabolic encephalopathy: Secondary | ICD-10-CM | POA: Diagnosis not present

## 2018-10-28 DIAGNOSIS — I5032 Chronic diastolic (congestive) heart failure: Secondary | ICD-10-CM | POA: Diagnosis not present

## 2018-10-28 DIAGNOSIS — J15212 Pneumonia due to Methicillin resistant Staphylococcus aureus: Secondary | ICD-10-CM | POA: Diagnosis not present

## 2018-10-28 NOTE — Progress Notes (Signed)
Pulmonary Critical Care Medicine [facility] [place]   PULMONARY CRITICAL CARE SERVICE  PROGRESS NOTE  Date of Service: 10/28/2018  Jesyca Woodfin  [id number]:[id number]  DOB: [birth year]   DOA: 10/26/2018  Referring Physician: Carron Curie, MD  HPI: Trevor Kehn is a 85 y.o. female seen for follow up of Acute on Chronic Respiratory Failure.  Patient is doing fairly well right now is on Vapotherm 20 L 50% FiO2  Medications: Reviewed on Rounds  Physical Exam:  Vitals: Temperature 97.3 pulse 61 respiratory 21 blood pressure 135/73 saturations 98%  Ventilator Settings off the ventilator on Vapotherm right now  . General: Comfortable at this time . Eyes: Grossly normal lids, irises & conjunctiva . ENT: grossly tongue is normal . Neck: no obvious mass . Cardiovascular: S1 S2 normal no gallop . Respiratory: Coarse breath sounds with rhonchi . Abdomen: soft . Skin: no rash seen on limited exam . Musculoskeletal: not rigid . Psychiatric:unable to assess . Neurologic: no seizure no involuntary movements         Lab Data:   Basic Metabolic Panel: Recent Labs  Lab 10/27/18 0605  NA 140  K 4.3  CL 98  CO2 28  GLUCOSE 105*  BUN 36*  CREATININE 1.13*  CALCIUM 9.5  MG 2.1    ABG: No results for input(s): PHART, PCO2ART, PO2ART, HCO3, O2SAT in the last 168 hours.  Liver Function Tests: Recent Labs  Lab 10/27/18 0605  AST 18  ALT 7  ALKPHOS 67  BILITOT 0.5  PROT 6.6  ALBUMIN 3.4*   No results for input(s): LIPASE, AMYLASE in the last 168 hours. No results for input(s): AMMONIA in the last 168 hours.  CBC: Recent Labs  Lab 10/27/18 0605  WBC 4.0  NEUTROABS 3.0  HGB 9.1*  HCT 29.8*  MCV 101.7*  PLT 349    Cardiac Enzymes: No results for input(s): CKTOTAL, CKMB, CKMBINDEX, TROPONINI in the last 168 hours.  BNP (last 3 results) No results for input(s): BNP in the last 8760 hours.  ProBNP (last 3 results) No results for input(s): PROBNP  in the last 8760 hours.  Radiological Exams: Dg Chest Port 1 View  Result Date: 10/27/2018 CLINICAL DATA:  Respiratory failure, hypoxia. EXAM: PORTABLE CHEST 1 VIEW COMPARISON:  None. FINDINGS: Mild cardiomegaly is noted. Probable large hiatal hernia is noted. Mild loculated left pleural effusion is noted with probable associated atelectasis or infiltrate. Diffuse right lung opacity is noted concerning for edema or pneumonia. Minimal right pleural effusion may be present. Bony thorax is unremarkable. IMPRESSION: Probable large hiatal hernia. Mild loculated left pleural effusion with adjacent atelectasis or infiltrate. Diffuse right lung opacity is noted concerning for edema or pneumonia. Electronically Signed   By: Lupita Raider M.D.   On: 10/27/2018 07:14    Assessment/Plan Active Problems:   Acute metabolic encephalopathy   Parkinson's disease (tremor, stiffness, slow motion, unstable posture) (HCC)   Chronic diastolic heart failure (HCC)   Acute on chronic respiratory failure with hypoxia (HCC)   MRSA pneumonia (HCC)   1. Acute on chronic respiratory failure with hypoxia we will continue with Vapotherm therapy continue secretion management pulmonary toilet. 2. Parkinson's disease unchanged 3. Chronic diastolic heart failure compensated diuretics as tolerated. 4. Metabolic encephalopathy grossly unchanged 5. MRSA pneumonia treated chest x-ray showing some possible infiltrate we will continue with supportive care   I have personally seen and evaluated the patient, evaluated laboratory and imaging results, formulated the assessment and plan and placed orders.  The Patient requires high complexity decision making for assessment and support.  Case was discussed on Rounds with the Respiratory Therapy Staff  Allyne Gee, MD [facility] Pulmonary Critical Care Medicine Sleep Medicine

## 2018-10-29 DIAGNOSIS — J9621 Acute and chronic respiratory failure with hypoxia: Secondary | ICD-10-CM | POA: Diagnosis not present

## 2018-10-29 DIAGNOSIS — G9341 Metabolic encephalopathy: Secondary | ICD-10-CM | POA: Diagnosis not present

## 2018-10-29 DIAGNOSIS — I5032 Chronic diastolic (congestive) heart failure: Secondary | ICD-10-CM | POA: Diagnosis not present

## 2018-10-29 DIAGNOSIS — J15212 Pneumonia due to Methicillin resistant Staphylococcus aureus: Secondary | ICD-10-CM | POA: Diagnosis not present

## 2018-10-29 LAB — BASIC METABOLIC PANEL WITH GFR
Anion gap: 7 (ref 5–15)
BUN: 37 mg/dL — ABNORMAL HIGH (ref 8–23)
CO2: 31 mmol/L (ref 22–32)
Calcium: 9.4 mg/dL (ref 8.9–10.3)
Chloride: 100 mmol/L (ref 98–111)
Creatinine, Ser: 1.09 mg/dL — ABNORMAL HIGH (ref 0.44–1.00)
GFR calc Af Amer: 53 mL/min — ABNORMAL LOW
GFR calc non Af Amer: 46 mL/min — ABNORMAL LOW
Glucose, Bld: 83 mg/dL (ref 70–99)
Potassium: 4.3 mmol/L (ref 3.5–5.1)
Sodium: 138 mmol/L (ref 135–145)

## 2018-10-29 LAB — CBC
HCT: 28.8 % — ABNORMAL LOW (ref 36.0–46.0)
Hemoglobin: 8.8 g/dL — ABNORMAL LOW (ref 12.0–15.0)
MCH: 30.8 pg (ref 26.0–34.0)
MCHC: 30.6 g/dL (ref 30.0–36.0)
MCV: 100.7 fL — ABNORMAL HIGH (ref 80.0–100.0)
Platelets: 357 10*3/uL (ref 150–400)
RBC: 2.86 MIL/uL — ABNORMAL LOW (ref 3.87–5.11)
RDW: 15.6 % — ABNORMAL HIGH (ref 11.5–15.5)
WBC: 6.4 10*3/uL (ref 4.0–10.5)
nRBC: 0 % (ref 0.0–0.2)

## 2018-10-29 NOTE — Progress Notes (Addendum)
Pulmonary Critical Care Medicine [facility] [place]   PULMONARY CRITICAL CARE SERVICE  PROGRESS NOTE  Date of Service: 10/29/2018  Jessica Bullock  OVZ:[id number]  DOB: 17-Mar-1930   DOA: 10/26/2018  Referring Physician: Carron Curie, MD  HPI: Jessica Bullock is a 85 y.o. female seen for follow up of Acute on Chronic Respiratory Failure.  Patient remains on heated high flow nasal cannula 50% FiO2 20 L.  Currently satting in the mid to upper 90s with no distress.  Medications: Reviewed on Rounds  Physical Exam:  Vitals: Pulse 63 respirations 20 BP 152/88 O2 sat 99% temp 93  Ventilator Settings currently on flow nasal cannula 50% 20 L  . General: Comfortable at this time . Eyes: Grossly normal lids, irises & conjunctiva . ENT: grossly tongue is normal . Neck: no obvious mass . Cardiovascular: S1 S2 normal no gallop . Respiratory: Coarse breath sounds . Abdomen: soft . Skin: no rash seen on limited exam . Musculoskeletal: not rigid . Psychiatric:unable to assess . Neurologic: no seizure no involuntary movements         Lab Data:   Basic Metabolic Panel: Recent Labs  Lab 10/27/18 0605 10/29/18 0626  NA 140 138  K 4.3 4.3  CL 98 100  CO2 28 31  GLUCOSE 105* 83  BUN 36* 37*  CREATININE 1.13* 1.09*  CALCIUM 9.5 9.4  MG 2.1  --     ABG: No results for input(s): PHART, PCO2ART, PO2ART, HCO3, O2SAT in the last 168 hours.  Liver Function Tests: Recent Labs  Lab 10/27/18 0605  AST 18  ALT 7  ALKPHOS 67  BILITOT 0.5  PROT 6.6  ALBUMIN 3.4*   No results for input(s): LIPASE, AMYLASE in the last 168 hours. No results for input(s): AMMONIA in the last 168 hours.  CBC: Recent Labs  Lab 10/27/18 0605 10/29/18 0626  WBC 4.0 6.4  NEUTROABS 3.0  --   HGB 9.1* 8.8*  HCT 29.8* 28.8*  MCV 101.7* 100.7*  PLT 349 357    Cardiac Enzymes: No results for input(s): CKTOTAL, CKMB, CKMBINDEX, TROPONINI in the last 168 hours.  BNP (last 3  results) No results for input(s): BNP in the last 8760 hours.  ProBNP (last 3 results) No results for input(s): PROBNP in the last 8760 hours.  Radiological Exams: No results found.  Assessment/Plan Active Problems:   Acute metabolic encephalopathy   Parkinson's disease (tremor, stiffness, slow motion, unstable posture) (HCC)   Chronic diastolic heart failure (HCC)   Acute on chronic respiratory failure with hypoxia (HCC)   MRSA pneumonia (HCC)   1. Acute chronic respiratory failure with hypoxia continue with heated high flow nasal cannula as well as 6 management pulmonary toilet 2. Parkinson's disease unchanged 3. Chronic diastolic heart failure compensated diuretics as tolerated 4. Metabolic encephalopathy grossly unchanged 5. MRSA pneumonia treated chest x-ray shows some infiltrate continue supportive care   I have personally seen and evaluated the patient, evaluated laboratory and imaging results, formulated the assessment and plan and placed orders. The Patient requires high complexity decision making for assessment and support.  Case was discussed on Rounds with the Respiratory Therapy Staff  Yevonne Pax, MD [facility] Pulmonary Critical Care Medicine Sleep Medicine

## 2018-10-30 DIAGNOSIS — J9621 Acute and chronic respiratory failure with hypoxia: Secondary | ICD-10-CM | POA: Diagnosis not present

## 2018-10-30 DIAGNOSIS — G9341 Metabolic encephalopathy: Secondary | ICD-10-CM | POA: Diagnosis not present

## 2018-10-30 DIAGNOSIS — I5032 Chronic diastolic (congestive) heart failure: Secondary | ICD-10-CM | POA: Diagnosis not present

## 2018-10-30 DIAGNOSIS — J15212 Pneumonia due to Methicillin resistant Staphylococcus aureus: Secondary | ICD-10-CM | POA: Diagnosis not present

## 2018-10-30 NOTE — Progress Notes (Addendum)
Pulmonary Critical Care Medicine [facility] [place]   PULMONARY CRITICAL CARE SERVICE  PROGRESS NOTE  Date of Service: 10/30/2018  Jessica Bullock  [id number]  DOB: Mar 22, 1930   DOA: 10/26/2018  Referring Physician: Carron Curie, MD  HPI: Jessica Bullock is a 85 y.o. female seen for follow up of Acute on Chronic Respiratory Failure.  Patient was on heated high flow 20 L and 50% FiO2.  Continues to sat in the mid 90s with no distress.  Medications: Reviewed on Rounds  Physical Exam:  Vitals: Pulse 61 respirations 21 BP 98/62 O2 sat 96% temp 96.5  Ventilator Settings heated high flow 50% FiO2 and 20 L  . General: Comfortable at this time . Eyes: Grossly normal lids, irises & conjunctiva . ENT: grossly tongue is normal . Neck: no obvious mass . Cardiovascular: S1 S2 normal no gallop . Respiratory: Coarse breath sounds . Abdomen: soft . Skin: no rash seen on limited exam . Musculoskeletal: not rigid . Psychiatric:unable to assess . Neurologic: no seizure no involuntary movements         Lab Data:   Basic Metabolic Panel: Recent Labs  Lab 10/27/18 0605 10/29/18 0626  NA 140 138  K 4.3 4.3  CL 98 100  CO2 28 31  GLUCOSE 105* 83  BUN 36* 37*  CREATININE 1.13* 1.09*  CALCIUM 9.5 9.4  MG 2.1  --     ABG: No results for input(s): PHART, PCO2ART, PO2ART, HCO3, O2SAT in the last 168 hours.  Liver Function Tests: Recent Labs  Lab 10/27/18 0605  AST 18  ALT 7  ALKPHOS 67  BILITOT 0.5  PROT 6.6  ALBUMIN 3.4*   No results for input(s): LIPASE, AMYLASE in the last 168 hours. No results for input(s): AMMONIA in the last 168 hours.  CBC: Recent Labs  Lab 10/27/18 0605 10/29/18 0626  WBC 4.0 6.4  NEUTROABS 3.0  --   HGB 9.1* 8.8*  HCT 29.8* 28.8*  MCV 101.7* 100.7*  PLT 349 357    Cardiac Enzymes: No results for input(s): CKTOTAL, CKMB, CKMBINDEX, TROPONINI in the last 168 hours.  BNP (last 3 results) No results for input(s):  BNP in the last 8760 hours.  ProBNP (last 3 results) No results for input(s): PROBNP in the last 8760 hours.  Radiological Exams: No results found.  Assessment/Plan Active Problems:   Acute metabolic encephalopathy   Parkinson's disease (tremor, stiffness, slow motion, unstable posture) (HCC)   Chronic diastolic heart failure (HCC)   Acute on chronic respiratory failure with hypoxia (HCC)   MRSA pneumonia (HCC)   1. Acute on chronic respiratory failure with hypoxia continue with heated nasal cannula.  Continue supportive measures and pulmonary toilet. 2. Parkinson's disease unchanged 3. Chronic diastolic heart failure compensated continue diuretics as tolerated 4. Metabolic encephalopathy grossly unchanged 5. M RSA pneumonia treated continue supportive care   I have personally seen and evaluated the patient, evaluated laboratory and imaging results, formulated the assessment and plan and placed orders. The Patient requires high complexity decision making for assessment and support.  Case was discussed on Rounds with the Respiratory Therapy Staff  Yevonne Pax, MD [facility] Pulmonary Critical Care Medicine Sleep Medicine

## 2018-10-31 DIAGNOSIS — J15212 Pneumonia due to Methicillin resistant Staphylococcus aureus: Secondary | ICD-10-CM | POA: Diagnosis not present

## 2018-10-31 DIAGNOSIS — G9341 Metabolic encephalopathy: Secondary | ICD-10-CM | POA: Diagnosis not present

## 2018-10-31 DIAGNOSIS — I5032 Chronic diastolic (congestive) heart failure: Secondary | ICD-10-CM | POA: Diagnosis not present

## 2018-10-31 DIAGNOSIS — J9621 Acute and chronic respiratory failure with hypoxia: Secondary | ICD-10-CM | POA: Diagnosis not present

## 2018-10-31 NOTE — Progress Notes (Addendum)
Pulmonary Critical Care Medicine [facility] [place]   PULMONARY CRITICAL CARE SERVICE  PROGRESS NOTE  Date of Service: 10/31/2018  Jessica Bullock  SNK:[id number]  DOB: 05-09-1930   DOA: 10/26/2018  Referring Physician: Carron Curie, MD  HPI: Jessica Bullock is a 85 y.o. female seen for follow up of Acute on Chronic Respiratory Failure.  Patient mains on heated high flow nasal cannula 20 L and 40% FiO2.  Currently satting in the upper 90s with no distress.    Medications: Reviewed on Rounds  Physical Exam:  Vitals: Pulse 63 respirations 19 BP 145/77 O2 sat 93% temp 96 on 4  Ventilator Settings heated high flow 20 L 40% FiO2  . General: Comfortable at this time . Eyes: Grossly normal lids, irises & conjunctiva . ENT: grossly tongue is normal . Neck: no obvious mass . Cardiovascular: S1 S2 normal no gallop . Respiratory: Coarse breath sounds . Abdomen: soft . Skin: no rash seen on limited exam . Musculoskeletal: not rigid . Psychiatric:unable to assess . Neurologic: no seizure no involuntary movements         Lab Data:   Basic Metabolic Panel: Recent Labs  Lab 10/27/18 0605 10/29/18 0626  NA 140 138  K 4.3 4.3  CL 98 100  CO2 28 31  GLUCOSE 105* 83  BUN 36* 37*  CREATININE 1.13* 1.09*  CALCIUM 9.5 9.4  MG 2.1  --     ABG: No results for input(s): PHART, PCO2ART, PO2ART, HCO3, O2SAT in the last 168 hours.  Liver Function Tests: Recent Labs  Lab 10/27/18 0605  AST 18  ALT 7  ALKPHOS 67  BILITOT 0.5  PROT 6.6  ALBUMIN 3.4*   No results for input(s): LIPASE, AMYLASE in the last 168 hours. No results for input(s): AMMONIA in the last 168 hours.  CBC: Recent Labs  Lab 10/27/18 0605 10/29/18 0626  WBC 4.0 6.4  NEUTROABS 3.0  --   HGB 9.1* 8.8*  HCT 29.8* 28.8*  MCV 101.7* 100.7*  PLT 349 357    Cardiac Enzymes: No results for input(s): CKTOTAL, CKMB, CKMBINDEX, TROPONINI in the last 168 hours.  BNP (last 3 results) No  results for input(s): BNP in the last 8760 hours.  ProBNP (last 3 results) No results for input(s): PROBNP in the last 8760 hours.  Radiological Exams: No results found.  Assessment/Plan Active Problems:   Acute metabolic encephalopathy   Parkinson's disease (tremor, stiffness, slow motion, unstable posture) (HCC)   Chronic diastolic heart failure (HCC)   Acute on chronic respiratory failure with hypoxia (HCC)   MRSA pneumonia (HCC)   1. Acute on chronic respiratory failure with hypoxia continue with heated high flow nasal cannula continue to titrate FiO2 as tolerated.  Continue supportive measures and pulmonary toilet. 2. Parkinson's disease unchanged 3. Chronic diastolic heart failure compensated continue diuretics as tolerated 4. Metabolic encephalopathy grossly unchanged 5. MRSA pneumonia treated continue supportive care   I have personally seen and evaluated the patient, evaluated laboratory and imaging results, formulated the assessment and plan and placed orders. The Patient requires high complexity decision making for assessment and support.  Case was discussed on Rounds with the Respiratory Therapy Staff  Yevonne Pax, MD [facility] Pulmonary Critical Care Medicine Sleep Medicine

## 2018-11-01 DIAGNOSIS — G9341 Metabolic encephalopathy: Secondary | ICD-10-CM | POA: Diagnosis not present

## 2018-11-01 DIAGNOSIS — J9621 Acute and chronic respiratory failure with hypoxia: Secondary | ICD-10-CM | POA: Diagnosis not present

## 2018-11-01 DIAGNOSIS — J15212 Pneumonia due to Methicillin resistant Staphylococcus aureus: Secondary | ICD-10-CM | POA: Diagnosis not present

## 2018-11-01 DIAGNOSIS — I5032 Chronic diastolic (congestive) heart failure: Secondary | ICD-10-CM | POA: Diagnosis not present

## 2018-11-01 NOTE — Progress Notes (Addendum)
Pulmonary Critical Care Medicine [facility] [place]  PULMONARY CRITICAL CARE SERVICE  PROGRESS NOTE  Date of Service: 11/01/2018  Jessica Bullock  OAC:[id number]  DOB: [birth year]   DOA: 10/26/2018  Referring Physician: Carron Curie, MD  HPI: Jessica Bullock is a 85 y.o. female seen for follow up of Acute on Chronic Respiratory Failure. Examination: General patient is a 78 L.  Currently satting well distress.  Medications: Reviewed on Rounds  Physical Exam:  Vitals: Pulse 65 respirations 30 BP 151/79 O2 sat 97% temp 96.4  Ventilator Settings heated high flow 30 L 40% FiO2  . General: Comfortable at this time . Eyes: Grossly normal lids, irises & conjunctiva . ENT: grossly tongue is normal . Neck: no obvious mass . Cardiovascular: S1 S2 normal no gallop . Respiratory: Coarse breath sounds . Abdomen: soft . Skin: no rash seen on limited exam . Musculoskeletal: not rigid . Psychiatric:unable to assess . Neurologic: no seizure no involuntary movements         Lab Data:   Basic Metabolic Panel: Recent Labs  Lab 10/27/18 0605 10/29/18 0626  NA 140 138  K 4.3 4.3  CL 98 100  CO2 28 31  GLUCOSE 105* 83  BUN 36* 37*  CREATININE 1.13* 1.09*  CALCIUM 9.5 9.4  MG 2.1  --     ABG: No results for input(s): PHART, PCO2ART, PO2ART, HCO3, O2SAT in the last 168 hours.  Liver Function Tests: Recent Labs  Lab 10/27/18 0605  AST 18  ALT 7  ALKPHOS 67  BILITOT 0.5  PROT 6.6  ALBUMIN 3.4*   No results for input(s): LIPASE, AMYLASE in the last 168 hours. No results for input(s): AMMONIA in the last 168 hours.  CBC: Recent Labs  Lab 10/27/18 0605 10/29/18 0626  WBC 4.0 6.4  NEUTROABS 3.0  --   HGB 9.1* 8.8*  HCT 29.8* 28.8*  MCV 101.7* 100.7*  PLT 349 357    Cardiac Enzymes: No results for input(s): CKTOTAL, CKMB, CKMBINDEX, TROPONINI in the last 168 hours.  BNP (last 3 results) No results for input(s): BNP in the last 8760  hours.  ProBNP (last 3 results) No results for input(s): PROBNP in the last 8760 hours.  Radiological Exams: No results found.  Assessment/Plan Active Problems:   Acute metabolic encephalopathy   Parkinson's disease (tremor, stiffness, slow motion, unstable posture) (HCC)   Chronic diastolic heart failure (HCC)   Acute on chronic respiratory failure with hypoxia (HCC)   MRSA pneumonia (HCC)   1. Acute on chronic respiratory failure hypoxia continue with heated high nasal cannula and titrate oxygen as tolerated.  Continue pulmonary toilet secretion management 2. Parkinson's disease unchanged 3. Chronic diastolic heart failure continue diuretics as tolerated 4. Stable encephalopathy grossly unchanged 5. MRSA pneumonia treated continue supportive care   I have personally seen and evaluated the patient, evaluated laboratory and imaging results, formulated the assessment and plan and placed orders. The Patient requires high complexity decision making for assessment and support.  Case was discussed on Rounds with the Respiratory Therapy Staff  Yevonne Pax, MD [facility] Pulmonary Critical Care Medicine Sleep Medicine

## 2018-11-02 DIAGNOSIS — J15212 Pneumonia due to Methicillin resistant Staphylococcus aureus: Secondary | ICD-10-CM | POA: Diagnosis not present

## 2018-11-02 DIAGNOSIS — G9341 Metabolic encephalopathy: Secondary | ICD-10-CM | POA: Diagnosis not present

## 2018-11-02 DIAGNOSIS — I5032 Chronic diastolic (congestive) heart failure: Secondary | ICD-10-CM | POA: Diagnosis not present

## 2018-11-02 DIAGNOSIS — J9621 Acute and chronic respiratory failure with hypoxia: Secondary | ICD-10-CM | POA: Diagnosis not present

## 2018-11-02 LAB — CBC
HCT: 31.2 % — ABNORMAL LOW (ref 36.0–46.0)
Hemoglobin: 9.7 g/dL — ABNORMAL LOW (ref 12.0–15.0)
MCH: 31.2 pg (ref 26.0–34.0)
MCHC: 31.1 g/dL (ref 30.0–36.0)
MCV: 100.3 fL — ABNORMAL HIGH (ref 80.0–100.0)
Platelets: 353 10*3/uL (ref 150–400)
RBC: 3.11 MIL/uL — ABNORMAL LOW (ref 3.87–5.11)
RDW: 16.6 % — ABNORMAL HIGH (ref 11.5–15.5)
WBC: 8 10*3/uL (ref 4.0–10.5)
nRBC: 0 % (ref 0.0–0.2)

## 2018-11-02 LAB — BASIC METABOLIC PANEL
Anion gap: 11 (ref 5–15)
BUN: 35 mg/dL — ABNORMAL HIGH (ref 8–23)
CO2: 34 mmol/L — ABNORMAL HIGH (ref 22–32)
Calcium: 9.2 mg/dL (ref 8.9–10.3)
Chloride: 96 mmol/L — ABNORMAL LOW (ref 98–111)
Creatinine, Ser: 1.17 mg/dL — ABNORMAL HIGH (ref 0.44–1.00)
GFR calc Af Amer: 49 mL/min — ABNORMAL LOW (ref >60)
GFR calc non Af Amer: 42 mL/min — ABNORMAL LOW (ref >60)
Glucose, Bld: 90 mg/dL (ref 70–99)
Potassium: 3.7 mmol/L (ref 3.5–5.1)
Sodium: 141 mmol/L (ref 135–145)

## 2018-11-02 LAB — MAGNESIUM: Magnesium: 1.7 mg/dL (ref 1.7–2.4)

## 2018-11-02 NOTE — Progress Notes (Addendum)
Pulmonary Critical Care Medicine [facility] [place]   PULMONARY CRITICAL CARE SERVICE  PROGRESS NOTE  Date of Service: 11/02/2018  Alyssha Guyett  UTM:[id number]  DOB: 10/31/1929   DOA: 10/26/2018  Referring Physician: Carron Curie, MD  HPI: Angie Shoaf is a 85 y.o. female seen for follow up of Acute on Chronic Respiratory Failure.  Patient remains on heated high flow nasal cannula 15 L and 40% FiO2.  Satting well with no distress currently.  Medications: Reviewed on Rounds  Physical Exam:  Vitals: Pulse 60 respirations 20 BP 145/63 O2 sat 100% temp 98.1  Ventilator Settings heated high flow 15 L 40% FiO2  . General: Comfortable at this time . Eyes: Grossly normal lids, irises & conjunctiva . ENT: grossly tongue is normal . Neck: no obvious mass . Cardiovascular: S1 S2 normal no gallop . Respiratory: Coarse breath sounds . Abdomen: soft . Skin: no rash seen on limited exam . Musculoskeletal: not rigid . Psychiatric:unable to assess . Neurologic: no seizure no involuntary movements         Lab Data:   Basic Metabolic Panel: Recent Labs  Lab 10/27/18 0605 10/29/18 0626 11/02/18 0548  NA 140 138 141  K 4.3 4.3 3.7  CL 98 100 96*  CO2 28 31 34*  GLUCOSE 105* 83 90  BUN 36* 37* 35*  CREATININE 1.13* 1.09* 1.17*  CALCIUM 9.5 9.4 9.2  MG 2.1  --  1.7    ABG: No results for input(s): PHART, PCO2ART, PO2ART, HCO3, O2SAT in the last 168 hours.  Liver Function Tests: Recent Labs  Lab 10/27/18 0605  AST 18  ALT 7  ALKPHOS 67  BILITOT 0.5  PROT 6.6  ALBUMIN 3.4*   No results for input(s): LIPASE, AMYLASE in the last 168 hours. No results for input(s): AMMONIA in the last 168 hours.  CBC: Recent Labs  Lab 10/27/18 0605 10/29/18 0626 11/02/18 0548  WBC 4.0 6.4 8.0  NEUTROABS 3.0  --   --   HGB 9.1* 8.8* 9.7*  HCT 29.8* 28.8* 31.2*  MCV 101.7* 100.7* 100.3*  PLT 349 357 353    Cardiac Enzymes: No results for input(s):  CKTOTAL, CKMB, CKMBINDEX, TROPONINI in the last 168 hours.  BNP (last 3 results) No results for input(s): BNP in the last 8760 hours.  ProBNP (last 3 results) No results for input(s): PROBNP in the last 8760 hours.  Radiological Exams: No results found.  Assessment/Plan Active Problems:   Acute metabolic encephalopathy   Parkinson's disease (tremor, stiffness, slow motion, unstable posture) (HCC)   Chronic diastolic heart failure (HCC)   Acute on chronic respiratory failure with hypoxia (HCC)   MRSA pneumonia (HCC)   1. Acute on chronic respiratory failure with hypoxia continue with heated high flow nasal cannula and titrate oxygen as tolerated.  Continue pulmonary toilet and secretion management 2. Parkinson's disease unchanged 3. Chronic diastolic heart failure continue diuretics as tolerated 4. Stable encephalopathy grossly unchanged 5. MRSA pneumonia treated continue supportive care   I have personally seen and evaluated the patient, evaluated laboratory and imaging results, formulated the assessment and plan and placed orders. The Patient requires high complexity decision making for assessment and support.  Case was discussed on Rounds with the Respiratory Therapy Staff  Yevonne Pax, MD [facility] Pulmonary Critical Care Medicine Sleep Medicine

## 2018-11-03 DIAGNOSIS — J15212 Pneumonia due to Methicillin resistant Staphylococcus aureus: Secondary | ICD-10-CM | POA: Diagnosis not present

## 2018-11-03 DIAGNOSIS — G9341 Metabolic encephalopathy: Secondary | ICD-10-CM | POA: Diagnosis not present

## 2018-11-03 DIAGNOSIS — I5032 Chronic diastolic (congestive) heart failure: Secondary | ICD-10-CM | POA: Diagnosis not present

## 2018-11-03 DIAGNOSIS — J9621 Acute and chronic respiratory failure with hypoxia: Secondary | ICD-10-CM | POA: Diagnosis not present

## 2018-11-03 LAB — MAGNESIUM: Magnesium: 1.9 mg/dL (ref 1.7–2.4)

## 2018-11-03 NOTE — Progress Notes (Addendum)
Pulmonary Critical Care Medicine [facility] [place]   PULMONARY CRITICAL CARE SERVICE  PROGRESS NOTE  Date of Service: 11/03/2018  Jessica Bullock  [id number]  DOB: Nov 04, 1929   DOA: 10/26/2018  Referring Physician: Carron Curie, MD  HPI: Jessica Bullock is a 85 y.o. female seen for follow up of Acute on Chronic Respiratory Failure.  Patient currently on 5 L of oxygen via nasal cannula.  Resting comfortably with no distress or fever at this time.  Medications: Reviewed on Rounds  Physical Exam:  Vitals: Pulse 63 respirations 20 BP 126/62 O2 sat 97% temp nine 7.6  Ventilator Settings 5 L nasal cannula  . General: Comfortable at this time . Eyes: Grossly normal lids, irises & conjunctiva . ENT: grossly tongue is normal . Neck: no obvious mass . Cardiovascular: S1 S2 normal no gallop . Respiratory: Coarse breath sounds . Abdomen: soft . Skin: no rash seen on limited exam . Musculoskeletal: not rigid . Psychiatric:unable to assess . Neurologic: no seizure no involuntary movements         Lab Data:   Basic Metabolic Panel: Recent Labs  Lab 10/29/18 0626 11/02/18 0548 11/03/18 0606  NA 138 141  --   K 4.3 3.7  --   CL 100 96*  --   CO2 31 34*  --   GLUCOSE 83 90  --   BUN 37* 35*  --   CREATININE 1.09* 1.17*  --   CALCIUM 9.4 9.2  --   MG  --  1.7 1.9    ABG: No results for input(s): PHART, PCO2ART, PO2ART, HCO3, O2SAT in the last 168 hours.  Liver Function Tests: No results for input(s): AST, ALT, ALKPHOS, BILITOT, PROT, ALBUMIN in the last 168 hours. No results for input(s): LIPASE, AMYLASE in the last 168 hours. No results for input(s): AMMONIA in the last 168 hours.  CBC: Recent Labs  Lab 10/29/18 0626 11/02/18 0548  WBC 6.4 8.0  HGB 8.8* 9.7*  HCT 28.8* 31.2*  MCV 100.7* 100.3*  PLT 357 353    Cardiac Enzymes: No results for input(s): CKTOTAL, CKMB, CKMBINDEX, TROPONINI in the last 168 hours.  BNP (last 3 results) No  results for input(s): BNP in the last 8760 hours.  ProBNP (last 3 results) No results for input(s): PROBNP in the last 8760 hours.  Radiological Exams: No results found.  Assessment/Plan Active Problems:   Acute metabolic encephalopathy   Parkinson's disease (tremor, stiffness, slow motion, unstable posture) (HCC)   Chronic diastolic heart failure (HCC)   Acute on chronic respiratory failure with hypoxia (HCC)   MRSA pneumonia (HCC)   1. Acute on chronic respiratory failure with hypoxia continue with 5 L of oxygen via nasal cannula continue to titrate as tolerated.  Continue secretion management pulmonary toilet 2. Parkinson's disease unchanged 3. Chronic diastolic heart failure continue diuretics as tolerated 4. Stable encephalopathy grossly unchanged 5. MRSA pneumonia treated continue supportive care   I have personally seen and evaluated the patient, evaluated laboratory and imaging results, formulated the assessment and plan and placed orders. The Patient requires high complexity decision making for assessment and support.  Case was discussed on Rounds with the Respiratory Therapy Staff  Yevonne Pax, MD [facility] Pulmonary Critical Care Medicine Sleep Medicine

## 2018-11-04 DIAGNOSIS — G9341 Metabolic encephalopathy: Secondary | ICD-10-CM | POA: Diagnosis not present

## 2018-11-04 DIAGNOSIS — I5032 Chronic diastolic (congestive) heart failure: Secondary | ICD-10-CM | POA: Diagnosis not present

## 2018-11-04 DIAGNOSIS — J15212 Pneumonia due to Methicillin resistant Staphylococcus aureus: Secondary | ICD-10-CM | POA: Diagnosis not present

## 2018-11-04 DIAGNOSIS — J9621 Acute and chronic respiratory failure with hypoxia: Secondary | ICD-10-CM | POA: Diagnosis not present

## 2018-11-04 NOTE — Progress Notes (Addendum)
Pulmonary Critical Care Medicine [facility] [place]   PULMONARY CRITICAL CARE SERVICE  PROGRESS NOTE  Date of Service: 11/04/2018  Jessica Bullock  BJY:[id number]  DOB: [birth year]   DOA: 10/26/2018  Referring Physician: Carron Curie, MD  HPI: Jessica Bullock is a 85 y.o. female seen for follow up of Acute on Chronic Respiratory Failure.  Patient continues on 3 L of oxygen via nasal cannula.  No acute distress or fever noted at this time.  Medications: Reviewed on Rounds  Physical Exam:  Vitals: Pulse 66 respirations 10 BP 118/53 O2 sat 96% temp 97.0  Ventilator Settings 3 L nasal cannula  . General: Comfortable at this time . Eyes: Grossly normal lids, irises & conjunctiva . ENT: grossly tongue is normal . Neck: no obvious mass . Cardiovascular: S1 S2 normal no gallop . Respiratory: Coarse breath sounds . Abdomen: soft . Skin: no rash seen on limited exam . Musculoskeletal: not rigid . Psychiatric:unable to assess . Neurologic: no seizure no involuntary movements         Lab Data:   Basic Metabolic Panel: Recent Labs  Lab 10/29/18 0626 11/02/18 0548 11/03/18 0606  NA 138 141  --   K 4.3 3.7  --   CL 100 96*  --   CO2 31 34*  --   GLUCOSE 83 90  --   BUN 37* 35*  --   CREATININE 1.09* 1.17*  --   CALCIUM 9.4 9.2  --   MG  --  1.7 1.9    ABG: No results for input(s): PHART, PCO2ART, PO2ART, HCO3, O2SAT in the last 168 hours.  Liver Function Tests: No results for input(s): AST, ALT, ALKPHOS, BILITOT, PROT, ALBUMIN in the last 168 hours. No results for input(s): LIPASE, AMYLASE in the last 168 hours. No results for input(s): AMMONIA in the last 168 hours.  CBC: Recent Labs  Lab 10/29/18 0626 11/02/18 0548  WBC 6.4 8.0  HGB 8.8* 9.7*  HCT 28.8* 31.2*  MCV 100.7* 100.3*  PLT 357 353    Cardiac Enzymes: No results for input(s): CKTOTAL, CKMB, CKMBINDEX, TROPONINI in the last 168 hours.  BNP (last 3 results) No results for  input(s): BNP in the last 8760 hours.  ProBNP (last 3 results) No results for input(s): PROBNP in the last 8760 hours.  Radiological Exams: No results found.  Assessment/Plan Active Problems:   Acute metabolic encephalopathy   Parkinson's disease (tremor, stiffness, slow motion, unstable posture) (HCC)   Chronic diastolic heart failure (HCC)   Acute on chronic respiratory failure with hypoxia (HCC)   MRSA pneumonia (HCC)   1. Acute on chronic respiratory failure with hypoxia continue with 3 L of oxygen via nasal cannula continue to wean as tolerated.  Continue pulmonary toilet secretion management 2. Parkinson's disease unchanged 3. Chronic diastolic heart failure continue diuretics as necessary 4. Stable encephalopathy grossly unchanged 5. MRSA pneumonia treated continue supportive care   I have personally seen and evaluated the patient, evaluated laboratory and imaging results, formulated the assessment and plan and placed orders. The Patient requires high complexity decision making for assessment and support.  Case was discussed on Rounds with the Respiratory Therapy Staff  Yevonne Pax, MD [facility] Pulmonary Critical Care Medicine Sleep Medicine

## 2018-11-05 DIAGNOSIS — J9621 Acute and chronic respiratory failure with hypoxia: Secondary | ICD-10-CM | POA: Diagnosis not present

## 2018-11-05 DIAGNOSIS — J15212 Pneumonia due to Methicillin resistant Staphylococcus aureus: Secondary | ICD-10-CM | POA: Diagnosis not present

## 2018-11-05 DIAGNOSIS — I5032 Chronic diastolic (congestive) heart failure: Secondary | ICD-10-CM | POA: Diagnosis not present

## 2018-11-05 DIAGNOSIS — G9341 Metabolic encephalopathy: Secondary | ICD-10-CM | POA: Diagnosis not present

## 2018-11-05 NOTE — Progress Notes (Addendum)
Pulmonary Critical Care Medicine [facility] [place]   PULMONARY CRITICAL CARE SERVICE  PROGRESS NOTE  Date of Service: 11/05/2018  Jessica Bullock  YYF:[id number]  DOB: Sep 07, 1929   DOA: 10/26/2018  Referring Physician: Carron Curie, MD  HPI: Jessica Bullock is a 85 y.o. female seen for follow up of Acute on Chronic Respiratory Failure.  Patient continues on 3 L of oxygen via nasal cannula.  Satting well with no fever or distress.  Medications: Reviewed on Rounds  Physical Exam:  Vitals: Pulse 65 respirations 16 BP 114/63 O2 sat 99% temp 97.0  Ventilator Settings 3 L nasal cannula  . General: Comfortable at this time . Eyes: Grossly normal lids, irises & conjunctiva . ENT: grossly tongue is normal . Neck: no obvious mass . Cardiovascular: S1 S2 normal no gallop . Respiratory: Coarse breath sounds . Abdomen: soft . Skin: no rash seen on limited exam . Musculoskeletal: not rigid . Psychiatric:unable to assess . Neurologic: no seizure no involuntary movements         Lab Data:   Basic Metabolic Panel: Recent Labs  Lab 11/02/18 0548 11/03/18 0606  NA 141  --   K 3.7  --   CL 96*  --   CO2 34*  --   GLUCOSE 90  --   BUN 35*  --   CREATININE 1.17*  --   CALCIUM 9.2  --   MG 1.7 1.9    ABG: No results for input(s): PHART, PCO2ART, PO2ART, HCO3, O2SAT in the last 168 hours.  Liver Function Tests: No results for input(s): AST, ALT, ALKPHOS, BILITOT, PROT, ALBUMIN in the last 168 hours. No results for input(s): LIPASE, AMYLASE in the last 168 hours. No results for input(s): AMMONIA in the last 168 hours.  CBC: Recent Labs  Lab 11/02/18 0548  WBC 8.0  HGB 9.7*  HCT 31.2*  MCV 100.3*  PLT 353    Cardiac Enzymes: No results for input(s): CKTOTAL, CKMB, CKMBINDEX, TROPONINI in the last 168 hours.  BNP (last 3 results) No results for input(s): BNP in the last 8760 hours.  ProBNP (last 3 results) No results for input(s): PROBNP in the  last 8760 hours.  Radiological Exams: No results found.  Assessment/Plan Active Problems:   Acute metabolic encephalopathy   Parkinson's disease (tremor, stiffness, slow motion, unstable posture) (HCC)   Chronic diastolic heart failure (HCC)   Acute on chronic respiratory failure with hypoxia (HCC)   MRSA pneumonia (HCC)   1. Acute on chronic respiratory failure with hypoxia continue with oxygen at 3 L and wean as tolerated.  Continue secretion management and supportive measures. 2. Parkinson's disease unchanged 3. Chronic diastolic heart failure continue diuretics as necessary 4. Stable encephalopathy grossly unchanged 5. MRSA pneumonia treated continue supportive care   I have personally seen and evaluated the patient, evaluated laboratory and imaging results, formulated the assessment and plan and placed orders. The Patient requires high complexity decision making for assessment and support.  Case was discussed on Rounds with the Respiratory Therapy Staff  Yevonne Pax, MD [facility] Pulmonary Critical Care Medicine Sleep Medicine

## 2018-11-06 DIAGNOSIS — I5032 Chronic diastolic (congestive) heart failure: Secondary | ICD-10-CM | POA: Diagnosis not present

## 2018-11-06 DIAGNOSIS — J15212 Pneumonia due to Methicillin resistant Staphylococcus aureus: Secondary | ICD-10-CM | POA: Diagnosis not present

## 2018-11-06 DIAGNOSIS — J9621 Acute and chronic respiratory failure with hypoxia: Secondary | ICD-10-CM | POA: Diagnosis not present

## 2018-11-06 DIAGNOSIS — G9341 Metabolic encephalopathy: Secondary | ICD-10-CM | POA: Diagnosis not present

## 2018-11-06 NOTE — Progress Notes (Addendum)
Pulmonary Critical Care Medicine [facility] [place]   PULMONARY CRITICAL CARE SERVICE  PROGRESS NOTE  Date of Service: 11/06/2018  Jessica Bullock  WCB:[id number]  DOB: 1929-09-08   DOA: 10/26/2018  Referring Physician: Carron Curie, MD  HPI: Jessica Bullock is a 85 y.o. female seen for follow up of Acute on Chronic Respiratory Failure.  Patient continues on 3 L of oxygen via nasal cannula.  No distress or fever noted.  Medications: Reviewed on Rounds  Physical Exam:  Vitals: Pulse 77 respirations 16 BP 114/58 O2 sat 100% temp 97.1  Ventilator Settings 3 L nasal cannula  . General: Comfortable at this time . Eyes: Grossly normal lids, irises & conjunctiva . ENT: grossly tongue is normal . Neck: no obvious mass . Cardiovascular: S1 S2 normal no gallop . Respiratory: Coarse breath sounds . Abdomen: soft . Skin: no rash seen on limited exam . Musculoskeletal: not rigid . Psychiatric:unable to assess . Neurologic: no seizure no involuntary movements         Lab Data:   Basic Metabolic Panel: Recent Labs  Lab 11/02/18 0548 11/03/18 0606  NA 141  --   K 3.7  --   CL 96*  --   CO2 34*  --   GLUCOSE 90  --   BUN 35*  --   CREATININE 1.17*  --   CALCIUM 9.2  --   MG 1.7 1.9    ABG: No results for input(s): PHART, PCO2ART, PO2ART, HCO3, O2SAT in the last 168 hours.  Liver Function Tests: No results for input(s): AST, ALT, ALKPHOS, BILITOT, PROT, ALBUMIN in the last 168 hours. No results for input(s): LIPASE, AMYLASE in the last 168 hours. No results for input(s): AMMONIA in the last 168 hours.  CBC: Recent Labs  Lab 11/02/18 0548  WBC 8.0  HGB 9.7*  HCT 31.2*  MCV 100.3*  PLT 353    Cardiac Enzymes: No results for input(s): CKTOTAL, CKMB, CKMBINDEX, TROPONINI in the last 168 hours.  BNP (last 3 results) No results for input(s): BNP in the last 8760 hours.  ProBNP (last 3 results) No results for input(s): PROBNP in the last 8760  hours.  Radiological Exams: No results found.  Assessment/Plan Active Problems:   Acute metabolic encephalopathy   Parkinson's disease (tremor, stiffness, slow motion, unstable posture) (HCC)   Chronic diastolic heart failure (HCC)   Acute on chronic respiratory failure with hypoxia (HCC)   MRSA pneumonia (HCC)   1. Acute on chronic respiratory failure with hypoxia continue oxygen at 3 L and wean as tolerated.  Continue supportive measures pulmonary toilet secretion management 2. Parkinson's disease unchanged 3. Consult heart failure continue diuretics as necessary 4. Stable encephalopathy grossly unchanged 5. MRSA pneumonia treated continue supportive care   I have personally seen and evaluated the patient, evaluated laboratory and imaging results, formulated the assessment and plan and placed orders. The Patient requires high complexity decision making for assessment and support.  Case was discussed on Rounds with the Respiratory Therapy Staff  Yevonne Pax, MD [facility] Pulmonary Critical Care Medicine Sleep Medicine

## 2018-11-07 DIAGNOSIS — J9621 Acute and chronic respiratory failure with hypoxia: Secondary | ICD-10-CM | POA: Diagnosis not present

## 2018-11-07 DIAGNOSIS — G9341 Metabolic encephalopathy: Secondary | ICD-10-CM | POA: Diagnosis not present

## 2018-11-07 DIAGNOSIS — J15212 Pneumonia due to Methicillin resistant Staphylococcus aureus: Secondary | ICD-10-CM | POA: Diagnosis not present

## 2018-11-07 DIAGNOSIS — I5032 Chronic diastolic (congestive) heart failure: Secondary | ICD-10-CM | POA: Diagnosis not present

## 2018-11-07 LAB — BASIC METABOLIC PANEL
Anion gap: 11 (ref 5–15)
BUN: 52 mg/dL — ABNORMAL HIGH (ref 8–23)
CO2: 31 mmol/L (ref 22–32)
Calcium: 9.3 mg/dL (ref 8.9–10.3)
Chloride: 97 mmol/L — ABNORMAL LOW (ref 98–111)
Creatinine, Ser: 1.18 mg/dL — ABNORMAL HIGH (ref 0.44–1.00)
GFR calc Af Amer: 48 mL/min — ABNORMAL LOW (ref >60)
GFR calc non Af Amer: 41 mL/min — ABNORMAL LOW (ref >60)
Glucose, Bld: 77 mg/dL (ref 70–99)
Potassium: 4.2 mmol/L (ref 3.5–5.1)
Sodium: 139 mmol/L (ref 135–145)

## 2018-11-07 LAB — CBC
HCT: 30 % — ABNORMAL LOW (ref 36.0–46.0)
Hemoglobin: 9.3 g/dL — ABNORMAL LOW (ref 12.0–15.0)
MCH: 31.2 pg (ref 26.0–34.0)
MCHC: 31 g/dL (ref 30.0–36.0)
MCV: 100.7 fL — ABNORMAL HIGH (ref 80.0–100.0)
Platelets: 253 10*3/uL (ref 150–400)
RBC: 2.98 MIL/uL — ABNORMAL LOW (ref 3.87–5.11)
RDW: 16.1 % — ABNORMAL HIGH (ref 11.5–15.5)
WBC: 5.5 10*3/uL (ref 4.0–10.5)
nRBC: 0 % (ref 0.0–0.2)

## 2018-11-07 LAB — MAGNESIUM: Magnesium: 1.8 mg/dL (ref 1.7–2.4)

## 2018-11-07 NOTE — Progress Notes (Addendum)
Pulmonary Critical Care Medicine [facility] [place]   PULMONARY CRITICAL CARE SERVICE  PROGRESS NOTE  Date of Service: 11/07/2018  Jessica Bullock  [id number]  DOB: 06/03/1930   DOA: 10/26/2018  Referring Physician: Carron Curie, MD  HPI: Jessica Bullock is a 85 y.o. female seen for follow up of Acute on Chronic Respiratory Failure.  Patient continues to sat well with no distress on 3 L of oxygen via nasal cannula.  Medications: Reviewed on Rounds  Physical Exam:  Vitals: Pulse 76 respirations 20 BP 114/58 O2 sat 100% temp 96.7  Ventilator Settings 3 L nasal cannula  . General: Comfortable at this time . Eyes: Grossly normal lids, irises & conjunctiva . ENT: grossly tongue is normal . Neck: no obvious mass . Cardiovascular: S1 S2 normal no gallop . Respiratory: No rales or rhonchi noted . Abdomen: soft . Skin: no rash seen on limited exam . Musculoskeletal: not rigid . Psychiatric:unable to assess . Neurologic: no seizure no involuntary movements         Lab Data:   Basic Metabolic Panel: Recent Labs  Lab 11/02/18 0548 11/03/18 0606 11/07/18 0607  NA 141  --  139  K 3.7  --  4.2  CL 96*  --  97*  CO2 34*  --  31  GLUCOSE 90  --  77  BUN 35*  --  52*  CREATININE 1.17*  --  1.18*  CALCIUM 9.2  --  9.3  MG 1.7 1.9 1.8    ABG: No results for input(s): PHART, PCO2ART, PO2ART, HCO3, O2SAT in the last 168 hours.  Liver Function Tests: No results for input(s): AST, ALT, ALKPHOS, BILITOT, PROT, ALBUMIN in the last 168 hours. No results for input(s): LIPASE, AMYLASE in the last 168 hours. No results for input(s): AMMONIA in the last 168 hours.  CBC: Recent Labs  Lab 11/02/18 0548 11/07/18 0607  WBC 8.0 5.5  HGB 9.7* 9.3*  HCT 31.2* 30.0*  MCV 100.3* 100.7*  PLT 353 253    Cardiac Enzymes: No results for input(s): CKTOTAL, CKMB, CKMBINDEX, TROPONINI in the last 168 hours.  BNP (last 3 results) No results for input(s): BNP in  the last 8760 hours.  ProBNP (last 3 results) No results for input(s): PROBNP in the last 8760 hours.  Radiological Exams: No results found.  Assessment/Plan Active Problems:   Acute metabolic encephalopathy   Parkinson's disease (tremor, stiffness, slow motion, unstable posture) (HCC)   Chronic diastolic heart failure (HCC)   Acute on chronic respiratory failure with hypoxia (HCC)   MRSA pneumonia (HCC)   1. Acute on chronic respiratory failure with hypoxia continue with oxygen at 3 L at this time.  Continue supportive measures and pulmonary toilet 2. Parkinson's disease unchanged 3. Chronic diastolic heart failure continue diuretics as necessary 4. Stable encephalopathy unchanged 5. MRSA pneumonia treated continue supportive care   I have personally seen and evaluated the patient, evaluated laboratory and imaging results, formulated the assessment and plan and placed orders. The Patient requires high complexity decision making for assessment and support.  Case was discussed on Rounds with the Respiratory Therapy Staff  Yevonne Pax, MD [facility] Pulmonary Critical Care Medicine Sleep Medicine

## 2018-11-08 DIAGNOSIS — J9621 Acute and chronic respiratory failure with hypoxia: Secondary | ICD-10-CM | POA: Diagnosis not present

## 2018-11-08 DIAGNOSIS — I5032 Chronic diastolic (congestive) heart failure: Secondary | ICD-10-CM | POA: Diagnosis not present

## 2018-11-08 DIAGNOSIS — J15212 Pneumonia due to Methicillin resistant Staphylococcus aureus: Secondary | ICD-10-CM | POA: Diagnosis not present

## 2018-11-08 DIAGNOSIS — G9341 Metabolic encephalopathy: Secondary | ICD-10-CM | POA: Diagnosis not present

## 2018-11-08 NOTE — Progress Notes (Addendum)
Pulmonary Critical Care Medicine [facility] [place]   PULMONARY CRITICAL CARE SERVICE  PROGRESS NOTE  Date of Service: 11/08/2018  Jessica Bullock  [id number]  DOB: 30-Jul-1929   DOA: 10/26/2018  Referring Physician: Carron Curie, MD  HPI: Jessica Bullock is a 85 y.o. female seen for follow up of Acute on Chronic Respiratory Failure.  Patient remains on 3 L of oxygen via nasal cannula.  Satting well with no distress at this time.  Medications: Reviewed on Rounds  Physical Exam:  Vitals: Pulse 77 respirations 19 BP 140/78 O2 sat 97% temp 96.8  Ventilator Settings 3 L nasal cannula  . General: Comfortable at this time . Eyes: Grossly normal lids, irises & conjunctiva . ENT: grossly tongue is normal . Neck: no obvious mass . Cardiovascular: S1 S2 normal no gallop . Respiratory: No rales or rhonchi noted . Abdomen: soft . Skin: no rash seen on limited exam . Musculoskeletal: not rigid . Psychiatric:unable to assess . Neurologic: no seizure no involuntary movements         Lab Data:   Basic Metabolic Panel: Recent Labs  Lab 11/02/18 0548 11/03/18 0606 11/07/18 0607  NA 141  --  139  K 3.7  --  4.2  CL 96*  --  97*  CO2 34*  --  31  GLUCOSE 90  --  77  BUN 35*  --  52*  CREATININE 1.17*  --  1.18*  CALCIUM 9.2  --  9.3  MG 1.7 1.9 1.8    ABG: No results for input(s): PHART, PCO2ART, PO2ART, HCO3, O2SAT in the last 168 hours.  Liver Function Tests: No results for input(s): AST, ALT, ALKPHOS, BILITOT, PROT, ALBUMIN in the last 168 hours. No results for input(s): LIPASE, AMYLASE in the last 168 hours. No results for input(s): AMMONIA in the last 168 hours.  CBC: Recent Labs  Lab 11/02/18 0548 11/07/18 0607  WBC 8.0 5.5  HGB 9.7* 9.3*  HCT 31.2* 30.0*  MCV 100.3* 100.7*  PLT 353 253    Cardiac Enzymes: No results for input(s): CKTOTAL, CKMB, CKMBINDEX, TROPONINI in the last 168 hours.  BNP (last 3 results) No results for  input(s): BNP in the last 8760 hours.  ProBNP (last 3 results) No results for input(s): PROBNP in the last 8760 hours.  Radiological Exams: No results found.  Assessment/Plan Active Problems:   Acute metabolic encephalopathy   Parkinson's disease (tremor, stiffness, slow motion, unstable posture) (HCC)   Chronic diastolic heart failure (HCC)   Acute on chronic respiratory failure with hypoxia (HCC)   MRSA pneumonia (HCC)   1. Acute on chronic respiratory failure with hypoxia continue with oxygen at 3 L at this time.  Continue supportive measures and pulmonary toilet 2. Parkinson's disease unchanged 3. Chronic diastolic heart failure continue diuretics as necessary 4. Stable encephalopathy unchanged 5. MRSA pneumonia treated continue supportive care   I have personally seen and evaluated the patient, evaluated laboratory and imaging results, formulated the assessment and plan and placed orders. The Patient requires high complexity decision making for assessment and support.  Case was discussed on Rounds with the Respiratory Therapy Staff  Yevonne Pax, MD [facility] Pulmonary Critical Care Medicine Sleep Medicine

## 2018-11-09 DIAGNOSIS — G9341 Metabolic encephalopathy: Secondary | ICD-10-CM | POA: Diagnosis not present

## 2018-11-09 DIAGNOSIS — J15212 Pneumonia due to Methicillin resistant Staphylococcus aureus: Secondary | ICD-10-CM | POA: Diagnosis not present

## 2018-11-09 DIAGNOSIS — I5032 Chronic diastolic (congestive) heart failure: Secondary | ICD-10-CM | POA: Diagnosis not present

## 2018-11-09 DIAGNOSIS — J9621 Acute and chronic respiratory failure with hypoxia: Secondary | ICD-10-CM | POA: Diagnosis not present

## 2018-11-09 NOTE — Progress Notes (Addendum)
Pulmonary Critical Care Medicine [facility] [place]   PULMONARY CRITICAL CARE SERVICE  PROGRESS NOTE  Date of Service: 11/09/2018  Jessica Bullock  YEM:[id number]  DOB: Mar 10, 1930   DOA: 10/26/2018  Referring Physician: Carron Curie, MD  HPI: Jessica Bullock is a 85 y.o. female seen for follow up of Acute on Chronic Respiratory Failure.  Patient is currently on 2 L of oxygen via nasal cannula satting well with no distress at this time.  Medications: Reviewed on Rounds  Physical Exam:  Vitals: Pulse 79 respirations 18 BP 131/57 O2 sat 100% temp 96.8  Ventilator Settings 2 L nasal cannula  . General: Comfortable at this time . Eyes: Grossly normal lids, irises & conjunctiva . ENT: grossly tongue is normal . Neck: no obvious mass . Cardiovascular: S1 S2 normal no gallop . Respiratory: No rales or rhonchi noted . Abdomen: soft . Skin: no rash seen on limited exam . Musculoskeletal: not rigid . Psychiatric:unable to assess . Neurologic: no seizure no involuntary movements         Lab Data:   Basic Metabolic Panel: Recent Labs  Lab 11/03/18 0606 11/07/18 0607  NA  --  139  K  --  4.2  CL  --  97*  CO2  --  31  GLUCOSE  --  77  BUN  --  52*  CREATININE  --  1.18*  CALCIUM  --  9.3  MG 1.9 1.8    ABG: No results for input(s): PHART, PCO2ART, PO2ART, HCO3, O2SAT in the last 168 hours.  Liver Function Tests: No results for input(s): AST, ALT, ALKPHOS, BILITOT, PROT, ALBUMIN in the last 168 hours. No results for input(s): LIPASE, AMYLASE in the last 168 hours. No results for input(s): AMMONIA in the last 168 hours.  CBC: Recent Labs  Lab 11/07/18 0607  WBC 5.5  HGB 9.3*  HCT 30.0*  MCV 100.7*  PLT 253    Cardiac Enzymes: No results for input(s): CKTOTAL, CKMB, CKMBINDEX, TROPONINI in the last 168 hours.  BNP (last 3 results) No results for input(s): BNP in the last 8760 hours.  ProBNP (last 3 results) No results for input(s):  PROBNP in the last 8760 hours.  Radiological Exams: No results found.  Assessment/Plan Active Problems:   Acute metabolic encephalopathy   Parkinson's disease (tremor, stiffness, slow motion, unstable posture) (HCC)   Chronic diastolic heart failure (HCC)   Acute on chronic respiratory failure with hypoxia (HCC)   MRSA pneumonia (HCC)   1. Acute on chronic respiratory failure with hypoxia continue with oxygen 2 L/min via nasal cannula.  Continue pulmonary toilet and supportive measures as well. 2. Parkinson's disease unchanged 3. Chronic diastolic heart failure continue diuretics as necessary 4. Stable encephalopathy unchanged 5. MRSA pneumonia treated continue to follow   I have personally seen and evaluated the patient, evaluated laboratory and imaging results, formulated the assessment and plan and placed orders. The Patient requires high complexity decision making for assessment and support.  Case was discussed on Rounds with the Respiratory Therapy Staff  Yevonne Pax, MD [facility] Pulmonary Critical Care Medicine Sleep Medicine

## 2018-11-10 DIAGNOSIS — J15212 Pneumonia due to Methicillin resistant Staphylococcus aureus: Secondary | ICD-10-CM | POA: Diagnosis not present

## 2018-11-10 DIAGNOSIS — J9621 Acute and chronic respiratory failure with hypoxia: Secondary | ICD-10-CM | POA: Diagnosis not present

## 2018-11-10 DIAGNOSIS — I5032 Chronic diastolic (congestive) heart failure: Secondary | ICD-10-CM | POA: Diagnosis not present

## 2018-11-10 DIAGNOSIS — G9341 Metabolic encephalopathy: Secondary | ICD-10-CM | POA: Diagnosis not present

## 2018-11-10 LAB — CBC
HCT: 32.5 % — ABNORMAL LOW (ref 36.0–46.0)
Hemoglobin: 10.1 g/dL — ABNORMAL LOW (ref 12.0–15.0)
MCH: 31.3 pg (ref 26.0–34.0)
MCHC: 31.1 g/dL (ref 30.0–36.0)
MCV: 100.6 fL — ABNORMAL HIGH (ref 80.0–100.0)
Platelets: 263 10*3/uL (ref 150–400)
RBC: 3.23 MIL/uL — ABNORMAL LOW (ref 3.87–5.11)
RDW: 16.4 % — ABNORMAL HIGH (ref 11.5–15.5)
WBC: 5.7 10*3/uL (ref 4.0–10.5)
nRBC: 0 % (ref 0.0–0.2)

## 2018-11-10 LAB — BASIC METABOLIC PANEL
Anion gap: 9 (ref 5–15)
BUN: 49 mg/dL — ABNORMAL HIGH (ref 8–23)
CO2: 28 mmol/L (ref 22–32)
Calcium: 10 mg/dL (ref 8.9–10.3)
Chloride: 100 mmol/L (ref 98–111)
Creatinine, Ser: 1.21 mg/dL — ABNORMAL HIGH (ref 0.44–1.00)
GFR calc Af Amer: 47 mL/min — ABNORMAL LOW (ref >60)
GFR calc non Af Amer: 40 mL/min — ABNORMAL LOW (ref >60)
Glucose, Bld: 78 mg/dL (ref 70–99)
Potassium: 5 mmol/L (ref 3.5–5.1)
Sodium: 137 mmol/L (ref 135–145)

## 2018-11-10 LAB — PHOSPHORUS: Phosphorus: 3.9 mg/dL (ref 2.5–4.6)

## 2018-11-10 LAB — MAGNESIUM: Magnesium: 1.9 mg/dL (ref 1.7–2.4)

## 2018-11-10 NOTE — Progress Notes (Addendum)
Pulmonary Critical Care Medicine [facility] [place]   PULMONARY CRITICAL CARE SERVICE  PROGRESS NOTE  Date of Service: 11/10/2018  Jessica Bullock  GYJ:[id number]  DOB: 11/02/1929   DOA: 10/26/2018  Referring Physician: Carron Curie, MD  HPI: Jessica Bullock is a 85 y.o. female seen for follow up of Acute on Chronic Respiratory Failure.  Patient continues on 2 L of oxygen via nasal cannula no fever or distress noted.  Medications: Reviewed on Rounds  Physical Exam:  Vitals: Pulse 64 respirations 20 BP 106/62 O2 sat 90% temp nine 6.8  Ventilator Settings 2 L nasal cannula  . General: Comfortable at this time . Eyes: Grossly normal lids, irises & conjunctiva . ENT: grossly tongue is normal . Neck: no obvious mass . Cardiovascular: S1 S2 normal no gallop . Respiratory: No rales or rhonchi noted . Abdomen: soft . Skin: no rash seen on limited exam . Musculoskeletal: not rigid . Psychiatric:unable to assess . Neurologic: no seizure no involuntary movements         Lab Data:   Basic Metabolic Panel: Recent Labs  Lab 11/07/18 0607 11/10/18 0554  NA 139 137  K 4.2 5.0  CL 97* 100  CO2 31 28  GLUCOSE 77 78  BUN 52* 49*  CREATININE 1.18* 1.21*  CALCIUM 9.3 10.0  MG 1.8 1.9  PHOS  --  3.9    ABG: No results for input(s): PHART, PCO2ART, PO2ART, HCO3, O2SAT in the last 168 hours.  Liver Function Tests: No results for input(s): AST, ALT, ALKPHOS, BILITOT, PROT, ALBUMIN in the last 168 hours. No results for input(s): LIPASE, AMYLASE in the last 168 hours. No results for input(s): AMMONIA in the last 168 hours.  CBC: Recent Labs  Lab 11/07/18 0607 11/10/18 0554  WBC 5.5 5.7  HGB 9.3* 10.1*  HCT 30.0* 32.5*  MCV 100.7* 100.6*  PLT 253 263    Cardiac Enzymes: No results for input(s): CKTOTAL, CKMB, CKMBINDEX, TROPONINI in the last 168 hours.  BNP (last 3 results) No results for input(s): BNP in the last 8760 hours.  ProBNP (last 3  results) No results for input(s): PROBNP in the last 8760 hours.  Radiological Exams: No results found.  Assessment/Plan Active Problems:   Acute metabolic encephalopathy   Parkinson's disease (tremor, stiffness, slow motion, unstable posture) (HCC)   Chronic diastolic heart failure (HCC)   Acute on chronic respiratory failure with hypoxia (HCC)   MRSA pneumonia (HCC)   1. Acute on chronic respiratory with hypoxia continue with oxygen 2 L/min via nasal cannula.  We will continue aggressive pulmonary toilet and supportive measures at this time. 2. Parkinson's disease unchanged 3. Chronic diastolic heart failure continue diuretics as necessary 4. Stable cephalopathy unchanged 5. MRSA pneumonia treated continue to follow   I have personally seen and evaluated the patient, evaluated laboratory and imaging results, formulated the assessment and plan and placed orders. The Patient requires high complexity decision making for assessment and support.  Case was discussed on Rounds with the Respiratory Therapy Staff  Yevonne Pax, MD [facility] Pulmonary Critical Care Medicine Sleep Medicine

## 2018-11-11 LAB — SARS CORONAVIRUS 2 BY RT PCR (HOSPITAL ORDER, PERFORMED IN ~~LOC~~ HOSPITAL LAB): SARS Coronavirus 2: NEGATIVE

## 2019-10-06 ENCOUNTER — Encounter (INDEPENDENT_AMBULATORY_CARE_PROVIDER_SITE_OTHER): Payer: Self-pay | Admitting: Ophthalmology

## 2019-10-06 ENCOUNTER — Ambulatory Visit (INDEPENDENT_AMBULATORY_CARE_PROVIDER_SITE_OTHER): Payer: Medicare Other | Admitting: Ophthalmology

## 2019-10-06 ENCOUNTER — Other Ambulatory Visit: Payer: Self-pay

## 2019-10-06 DIAGNOSIS — H353132 Nonexudative age-related macular degeneration, bilateral, intermediate dry stage: Secondary | ICD-10-CM | POA: Insufficient documentation

## 2019-10-06 DIAGNOSIS — H353211 Exudative age-related macular degeneration, right eye, with active choroidal neovascularization: Secondary | ICD-10-CM | POA: Insufficient documentation

## 2019-10-06 DIAGNOSIS — H35721 Serous detachment of retinal pigment epithelium, right eye: Secondary | ICD-10-CM | POA: Insufficient documentation

## 2019-10-06 MED ORDER — BEVACIZUMAB CHEMO INJECTION 1.25MG/0.05ML SYRINGE FOR KALEIDOSCOPE
1.2500 mg | INTRAVITREAL | Status: AC | PRN
  Administered 2019-10-06: 12:00:00 1.25 mg via INTRAVITREAL

## 2019-10-06 NOTE — Progress Notes (Signed)
10/06/2019     CHIEF COMPLAINT Patient presents for Retina Follow Up   HISTORY OF PRESENT ILLNESS: Jessica Bullock is a 86 y.o. female who presents to the clinic today for:   HPI    Retina Follow Up    Patient presents with  Wet AMD.  In right eye.  Duration of 5 weeks.  Since onset it is gradually worsening.          Comments    5 week follow up - OCT OU, Possible Avastin OD Patient states she thinks her vision has decreased since last visit. Patient states that she has had several floaters since her last injection, and they have lasted longer than usual.       Last edited by Berenice Bouton on 10/06/2019 11:06 AM. (History)      Referring physician: Edmon Crape, MD [address] [place],  [place] [address]  HISTORICAL INFORMATION:   Selected notes from the MEDICAL RECORD NUMBER       CURRENT MEDICATIONS: No current outpatient medications on file. (Ophthalmic Drugs)   No current facility-administered medications for this visit. (Ophthalmic Drugs)   Current Outpatient Medications (Other)  Medication Sig  . ciprofloxacin (CIPRO) 500 MG tablet TAKE 1 TABLET BY MOUTH EVERY 12 HOURS FOR 7 DAYS  . clopidogrel (PLAVIX) 75 MG tablet Take 75 mg by mouth daily.  . EUTHYROX 100 MCG tablet Take 100 mcg by mouth daily.  . furosemide (LASIX) 20 MG tablet Take 20 mg by mouth daily.  . hydrALAZINE (APRESOLINE) 10 MG tablet Take 10 mg by mouth 2 (two) times daily.  . Multiple Vitamin (MULTIVITAMIN) tablet Take 1 tablet by mouth at bedtime.  . pantoprazole (PROTONIX) 40 MG tablet Take 40 mg by mouth 2 (two) times daily.  . predniSONE (DELTASONE) 5 MG tablet Take 5 mg by mouth daily.  . pregabalin (LYRICA) 50 MG capsule Take 50 mg by mouth at bedtime.  Marland Kitchen rOPINIRole (REQUIP) 2 MG tablet Take 2 mg by mouth 4 (four) times daily.  . tamsulosin (FLOMAX) 0.4 MG CAPS capsule Take 0.4 mg by mouth daily.   No current facility-administered medications for this visit. (Other)       REVIEW OF SYSTEMS:    ALLERGIES Allergies  Allergen Reactions  . Sulfa Antibiotics     PAST MEDICAL HISTORY Past Medical History:  Diagnosis Date  . Acute metabolic encephalopathy   . Acute on chronic respiratory failure with hypoxia (HCC)   . Aspiration pneumonia due to gastric secretions (HCC)   . Chronic diastolic heart failure (HCC)   . Hypertension   . Parkinson's disease (tremor, stiffness, slow motion, unstable posture) (HCC)    Past Surgical History:  Procedure Laterality Date  . CATARACT EXTRACTION Right    Dr. Suzi Roots  . CATARACT EXTRACTION Left    Dr. Suzi Roots  . HIP SURGERY      FAMILY HISTORY History reviewed. No pertinent family history.  SOCIAL HISTORY Social History   Tobacco Use  . Smoking status: Never Smoker  . Smokeless tobacco: Never Used  Substance Use Topics  . Alcohol use: Not on file  . Drug use: Not on file         OPHTHALMIC EXAM:  Base Eye Exam    Visual Acuity (Snellen - Linear)      Right Left   Dist cc 20/40-2 20/25-2   Dist ph cc NI    Correction: Glasses       Tonometry (Tonopen, 11:19 AM)  Right Left   Pressure 13 9       Pupils      Pupils Dark Light Shape React APD   Right PERRL 4 3 Round Slow None   Left PERRL 4 3 Round Slow None       Visual Fields (Counting fingers)      Left Right    Full Full       Extraocular Movement      Right Left    Full Full       Neuro/Psych    Oriented x3: Yes   Mood/Affect: Normal       Dilation    Right eye: 1.0% Mydriacyl, 2.5% Phenylephrine @ 11:19 AM        Slit Lamp and Fundus Exam    External Exam      Right Left   External Normal Normal       Slit Lamp Exam      Right Left   Lids/Lashes Normal Normal   Conjunctiva/Sclera White and quiet White and quiet   Cornea Clear Clear   Anterior Chamber Deep and quiet Deep and quiet   Iris Round and reactive Round and reactive   Lens Posterior chamber intraocular lens Posterior chamber  intraocular lens   Anterior Vitreous Normal Normal       Fundus Exam      Right Left   Posterior Vitreous Posterior vitreous detachment    Disc Normal    C/D Ratio 0.3    Macula Retinal pigment epithelial mottling, Subretinal neovascular membrane, no hemorrhage, no exudates, Early age related macular degeneration, Retinal pigment epithelial atrophy    Vessels Normal    Periphery Normal           IMAGING AND PROCEDURES  Imaging and Procedures for 10/06/19  OCT, Retina - OU - Both Eyes       Right Eye Quality was good. Scan locations included subfoveal. Central Foveal Thickness: 303. Progression has improved. Findings include subretinal hyper-reflective material, subretinal fluid.   Left Eye Quality was good. Scan locations included subfoveal. Central Foveal Thickness: 285. Progression has been stable. Findings include retinal drusen , no IRF, no SRF.   Notes OD, less subretinal fluid as compared to September 01, 2019, after Avastin.  Current interval of examination is 5 weeks.  OS stable no active disease       Intravitreal Injection, Pharmacologic Agent - OD - Right Eye       Time Out 10/06/2019. 12:26 PM. Confirmed correct patient, procedure, site, and patient consented.   Anesthesia Topical anesthesia was used. Anesthetic medications included Akten 3.5%.   Procedure Preparation included Ofloxacin , Tobramycin 0.3%, 10% betadine to eyelids. A 30 gauge needle was used.   Injection:  1.25 mg Bevacizumab (AVASTIN) SOLN   NDC: [address]-5   Route: Intravitreal, Site: Right Eye, Waste: 0 mg  Post-op Post injection exam found visual acuity of at least counting fingers. The patient tolerated the procedure well. There were no complications. The patient received written and verbal post procedure care education. Post injection medications were not given.                 ASSESSMENT/PLAN:  Exudative age-related macular degeneration of right eye with active  choroidal neovascularization (HCC) The nature of wet macular degeneration was discussed with the patient.  Forms of therapy reviewed include the use of Anti-VEGF medications injected painlessly into the eye, as well as other possible treatment modalities, including thermal laser therapy. Fellow eye  involvement and risks were discussed with the patient. Upon the finding of wet age related macular degeneration, treatment will be offered. The treatment regimen is on a treat as needed basis with the intent to treat if necessary and extend interval of exams when possible. On average 1 out of 6 patients do not need lifetime therapy. However, the risk of recurrent disease is high for a lifetime.  Initially monthly, then periodic, examinations and evaluations will determine whether the next treatment is required on the day of the examination.  Improved OD at 5-week interval of examination.,  Repeat Avastin today      ICD-10-CM   1. Exudative age-related macular degeneration of right eye with active choroidal neovascularization (HCC)  H35.3211 OCT, Retina - OU - Both Eyes    Intravitreal Injection, Pharmacologic Agent - OD - Right Eye    Bevacizumab (AVASTIN) SOLN 1.25 mg  2. Serous detachment of retinal pigment epithelium of right eye  H35.721   3. Intermediate stage nonexudative age-related macular degeneration of both eyes  H35.3132     1.OD, less subretinal fluid as compared to September 01, 2019, after Avastin.  Current interval of examination is 5 weeks.  2.  3.  Ophthalmic Meds Ordered this visit:  Meds ordered this encounter  Medications  . Bevacizumab (AVASTIN) SOLN 1.25 mg       No follow-ups on file.  There are no Patient Instructions on file for this visit.   Explained the diagnoses, plan, and follow up with the patient and they expressed understanding.  Patient expressed understanding of the importance of proper follow up care.   Alford Highland Genevia Bouldin M.D. Diseases & Surgery of the Retina  and [facility] 10/06/19     Abbreviations: M myopia (nearsighted); A astigmatism; H hyperopia (farsighted); P presbyopia; Mrx spectacle prescription;  CTL contact lenses; OD right eye; OS left eye; OU both eyes  XT exotropia; ET esotropia; PEK punctate epithelial keratitis; PEE punctate epithelial erosions; DES dry eye syndrome; MGD meibomian gland dysfunction; ATs artificial tears; PFAT's preservative free artificial tears; NSC nuclear sclerotic cataract; PSC posterior subcapsular cataract; ERM epi-retinal membrane; PVD posterior vitreous detachment; RD retinal detachment; DM diabetes mellitus; DR diabetic retinopathy; NPDR non-proliferative diabetic retinopathy; PDR proliferative diabetic retinopathy; CSME clinically significant macular edema; DME diabetic macular edema; dbh dot blot hemorrhages; CWS cotton wool spot; POAG primary open angle glaucoma; C/D cup-to-disc ratio; HVF humphrey visual field; GVF goldmann visual field; OCT optical coherence tomography; IOP intraocular pressure; BRVO Branch retinal vein occlusion; CRVO central retinal vein occlusion; CRAO central retinal artery occlusion; BRAO branch retinal artery occlusion; RT retinal tear; SB scleral buckle; PPV pars plana vitrectomy; VH Vitreous hemorrhage; PRP panretinal laser photocoagulation; IVK intravitreal kenalog; VMT vitreomacular traction; MH Macular hole;  NVD neovascularization of the disc; NVE neovascularization elsewhere; AREDS age related eye disease study; ARMD age related macular degeneration; POAG primary open angle glaucoma; EBMD epithelial/anterior basement membrane dystrophy; ACIOL anterior chamber intraocular lens; IOL intraocular lens; PCIOL posterior chamber intraocular lens; Phaco/IOL phacoemulsification with intraocular lens placement; PRK photorefractive keratectomy; LASIK laser assisted in situ keratomileusis; HTN hypertension; DM diabetes mellitus; COPD chronic obstructive pulmonary  disease

## 2019-10-06 NOTE — Assessment & Plan Note (Signed)
The nature of wet macular degeneration was discussed with the patient.  Forms of therapy reviewed include the use of Anti-VEGF medications injected painlessly into the eye, as well as other possible treatment modalities, including thermal laser therapy. Fellow eye involvement and risks were discussed with the patient. Upon the finding of wet age related macular degeneration, treatment will be offered. The treatment regimen is on a treat as needed basis with the intent to treat if necessary and extend interval of exams when possible. On average 1 out of 6 patients do not need lifetime therapy. However, the risk of recurrent disease is high for a lifetime.  Initially monthly, then periodic, examinations and evaluations will determine whether the next treatment is required on the day of the examination.  Improved OD at 5-week interval of examination.,  Repeat Avastin today

## 2019-11-10 ENCOUNTER — Encounter (INDEPENDENT_AMBULATORY_CARE_PROVIDER_SITE_OTHER): Payer: Medicare Other | Admitting: Ophthalmology

## 2019-12-15 DEATH — deceased

## 2020-09-23 IMAGING — DX PORTABLE CHEST - 1 VIEW
1 series · 1 of 1 positions shown · non-contrast
Comparison: None.

CLINICAL DATA: Respiratory failure, hypoxia.

EXAM:
PORTABLE CHEST 1 VIEW

[chest ap]
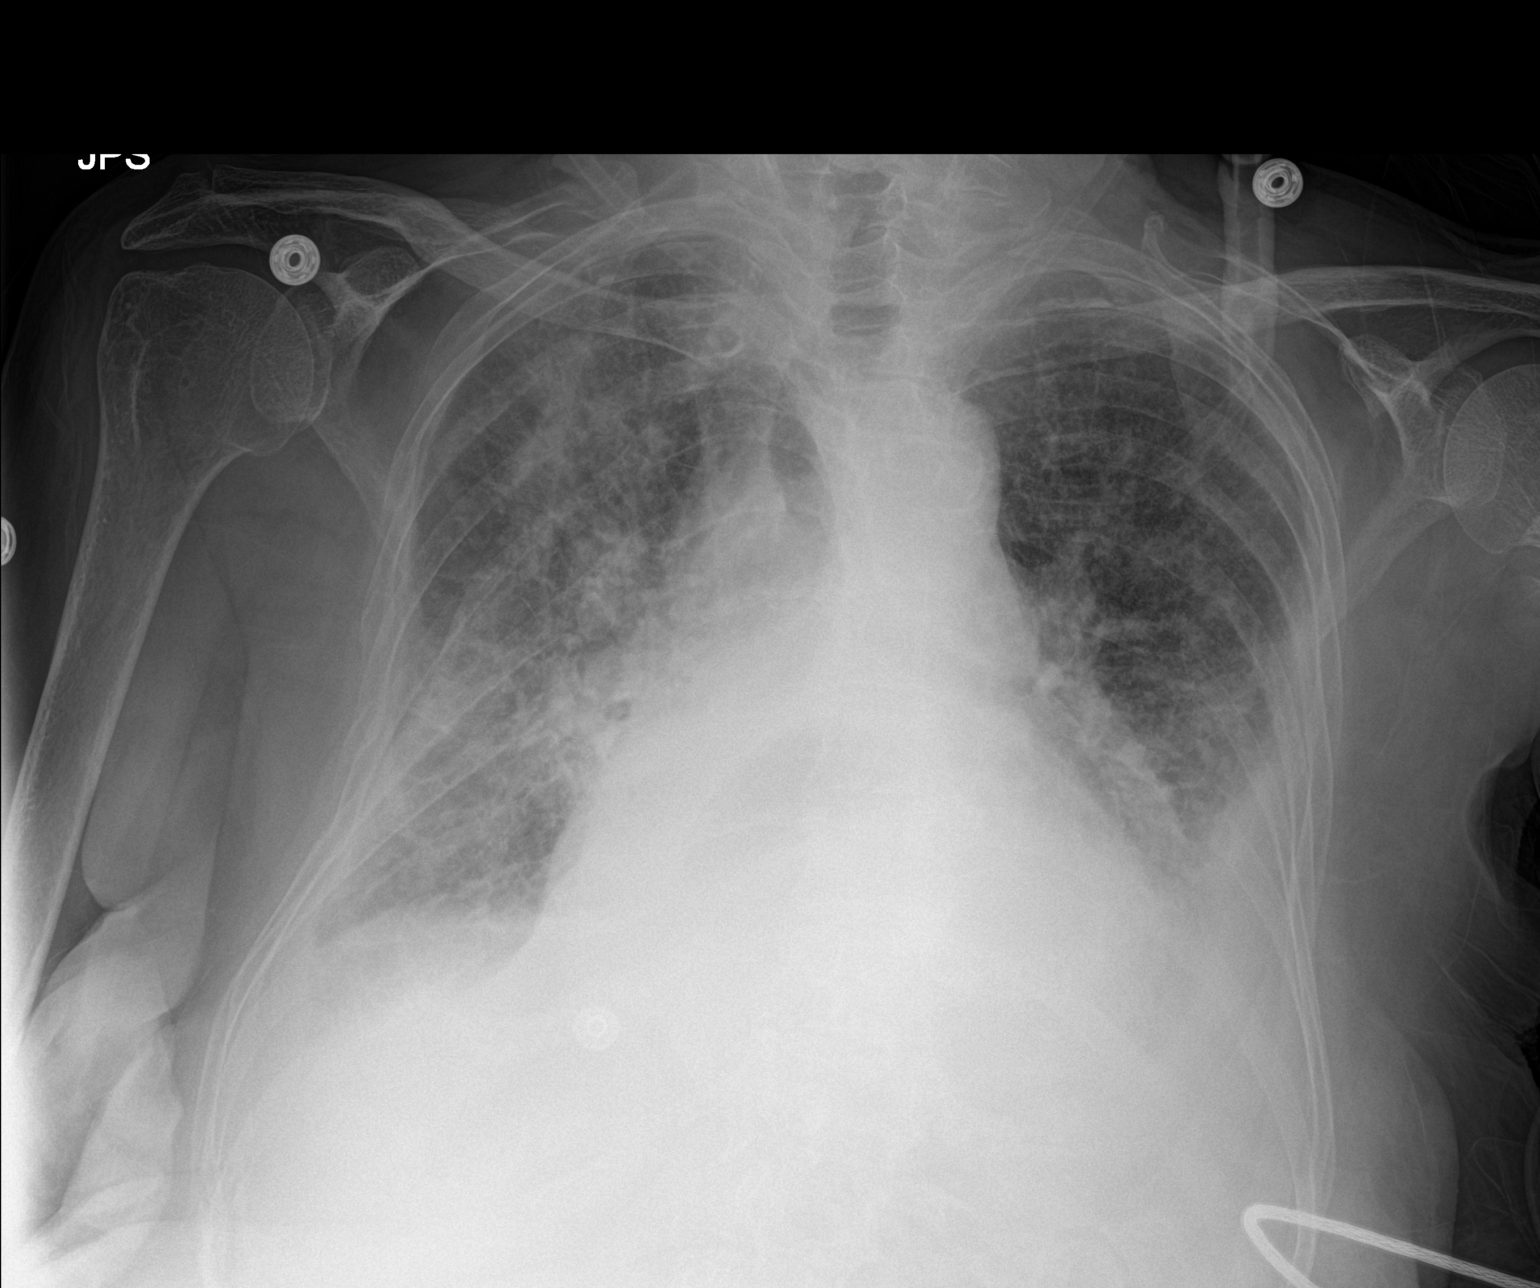

[1 of 1 positions shown; findings below may reference images not displayed]

FINDINGS: Mild cardiomegaly is noted. Probable large hiatal hernia is noted.
Mild loculated left pleural effusion is noted with probable
associated atelectasis or infiltrate. Diffuse right lung opacity is
noted concerning for edema or pneumonia. Minimal right pleural
effusion may be present. Bony thorax is unremarkable.
IMPRESSION: Probable large hiatal hernia. Mild loculated left pleural effusion
with adjacent atelectasis or infiltrate. Diffuse right lung opacity
is noted concerning for edema or pneumonia.
